# Patient Record
Sex: Female | Born: 1995 | Race: Black or African American | Hispanic: No | Marital: Single | Smoking: Never smoker
Health system: Southern US, Community
[De-identification: ages and names within clinical notes are randomized; demographics above are authoritative.]

## PROBLEM LIST (undated history)

## (undated) DIAGNOSIS — Z789 Other specified health status: Secondary | ICD-10-CM

## (undated) DIAGNOSIS — K5900 Constipation, unspecified: Secondary | ICD-10-CM

## (undated) DIAGNOSIS — A64 Unspecified sexually transmitted disease: Secondary | ICD-10-CM

## (undated) HISTORY — DX: Unspecified sexually transmitted disease: A64

## (undated) HISTORY — PX: NO PAST SURGERIES: SHX2092

## (undated) HISTORY — DX: Other specified health status: Z78.9

---
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
## 2017-12-30 ENCOUNTER — Other Ambulatory Visit: Payer: Self-pay | Admitting: Internal Medicine

## 2017-12-30 DIAGNOSIS — R1031 Right lower quadrant pain: Secondary | ICD-10-CM

## 2017-12-30 DIAGNOSIS — R1032 Left lower quadrant pain: Principal | ICD-10-CM

## 2018-01-28 ENCOUNTER — Emergency Department: Payer: 59

## 2018-01-28 ENCOUNTER — Emergency Department
Admission: EM | Admit: 2018-01-28 | Discharge: 2018-01-28 | Disposition: A | Discharge status: Home / Self Care | Payer: 59 | Attending: Emergency Medicine | Admitting: Emergency Medicine

## 2018-01-28 ENCOUNTER — Other Ambulatory Visit: Payer: Self-pay

## 2018-01-28 DIAGNOSIS — R1030 Lower abdominal pain, unspecified: Secondary | ICD-10-CM | POA: Insufficient documentation

## 2018-01-28 DIAGNOSIS — K921 Melena: Secondary | ICD-10-CM

## 2018-01-28 DIAGNOSIS — K625 Hemorrhage of anus and rectum: Secondary | ICD-10-CM | POA: Diagnosis present

## 2018-01-28 HISTORY — DX: Constipation, unspecified: K59.00

## 2018-01-28 LAB — COMPREHENSIVE METABOLIC PANEL
ALT: 17 U/L (ref 14–54)
ANION GAP: 9 (ref 5–15)
AST: 25 U/L (ref 15–41)
Albumin: 4.1 g/dL (ref 3.5–5.0)
Alkaline Phosphatase: 80 U/L (ref 38–126)
BUN: 8 mg/dL (ref 6–20)
CHLORIDE: 106 mmol/L (ref 101–111)
CO2: 24 mmol/L (ref 22–32)
Calcium: 9.5 mg/dL (ref 8.9–10.3)
Creatinine, Ser: 0.62 mg/dL (ref 0.44–1.00)
GFR calc Af Amer: >60 mL/min (ref >60)
GFR calc non Af Amer: >60 mL/min (ref >60)
GLUCOSE: 80 mg/dL (ref 65–99)
POTASSIUM: 4.2 mmol/L (ref 3.5–5.1)
SODIUM: 139 mmol/L (ref 135–145)
Total Bilirubin: 0.5 mg/dL (ref 0.3–1.2)
Total Protein: 7.7 g/dL (ref 6.5–8.1)

## 2018-01-28 LAB — CBC
HEMATOCRIT: 38.5 % (ref 35.0–47.0)
HEMOGLOBIN: 12.4 g/dL (ref 12.0–16.0)
MCH: 27.8 pg (ref 26.0–34.0)
MCHC: 32.1 g/dL (ref 32.0–36.0)
MCV: 86.5 fL (ref 80.0–100.0)
Platelets: 360 10*3/uL (ref 150–440)
RBC: 4.46 MIL/uL (ref 3.80–5.20)
RDW: 13 % (ref 11.5–14.5)
WBC: 6.4 10*3/uL (ref 3.6–11.0)

## 2018-01-28 LAB — URINALYSIS, COMPLETE (UACMP) WITH MICROSCOPIC
BACTERIA UA: NONE SEEN
BILIRUBIN URINE: NEGATIVE
Glucose, UA: NEGATIVE mg/dL
HGB URINE DIPSTICK: NEGATIVE
Ketones, ur: NEGATIVE mg/dL
Nitrite: NEGATIVE
PROTEIN: NEGATIVE mg/dL
SPECIFIC GRAVITY, URINE: 1.015 (ref 1.005–1.030)
pH: 5 (ref 5.0–8.0)

## 2018-01-28 LAB — POCT PREGNANCY, URINE: PREG TEST UR: NEGATIVE

## 2018-01-28 LAB — LIPASE, BLOOD: LIPASE: 26 U/L (ref 11–51)

## 2018-01-28 MED ORDER — IOPAMIDOL (ISOVUE-300) INJECTION 61%
30.0000 mL | Freq: Once | INTRAVENOUS | Status: AC | PRN
  Administered 2018-01-28: 30 mL via ORAL
  Filled 2018-01-28: qty 30

## 2018-01-28 MED ORDER — IOPAMIDOL (ISOVUE-300) INJECTION 61%
100.0000 mL | Freq: Once | INTRAVENOUS | Status: AC | PRN
  Administered 2018-01-28: 100 mL via INTRAVENOUS
  Filled 2018-01-28: qty 100

## 2018-01-28 NOTE — ED Notes (Addendum)
Pt returned from CT °

## 2018-01-28 NOTE — ED Provider Notes (Signed)
[facility] Emergency Department Provider Note  ____________________________________________   First MD Initiated Contact with Patient 01/28/18 1758     (approximate)  I have reviewed the triage vital signs and the nursing notes.   HISTORY  Chief Complaint Constipation and Rectal Bleeding    HPI Brandi Esmeralda ArthurKimber is a 22 y.o. female with medical history as listed below who presents for evaluation of new onset bright red blood per rectum associated with bowel movements.  This is been going on for a couple of days.  She states that she has also had intermittent lower abdominal pain for weeks but which is been more frequent and more severe over the last few days.  She has a history of constipation for years and occasionally takes MiraLAX and she does feel that constipation is an ongoing issue but she has never had bleeding before.  She has not been straining harder than usual recently.  She denies fever/chills, chest pain, shortness of breath, nausea, vomiting, and upper abdominal pain.  She states that she has been sexually active in the past but that she always, without fail, make sure that her partner used condoms.  She also reports that her primary care provider did a pelvic exam and took swabs recently and she does not want to have another exam today.  Nothing in particular makes her symptoms better or worse.  Past Medical History:  Diagnosis Date  . Constipation   . Medical history non-contributory     Patient Active Problem List   Diagnosis Date Noted  . MDD (major depressive disorder), recurrent episode, severe (HCC) 01/11/2013  . PTSD (post-traumatic stress disorder) 01/11/2013  . 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 NO PAST SURGERIES      Prior to Admission medications   Medication Sig Start Date End Date Taking? Authorizing Provider  buPROPion (WELLBUTRIN XL) 150 MG 24 hr tablet Take 1 tablet  (150 mg total) by mouth daily. 01/15/13   Winson, Louie BunKim B, NP  ibuprofen (ADVIL,MOTRIN) 200 MG tablet Take 2 tablets (400 mg total) by mouth every 6 (six) hours as needed for pain or headache. Patient may resume home supply. 01/15/13   Winson, Louie BunKim B, NP  mirtazapine (REMERON) 15 MG tablet Take 1 tablet (15 mg total) by mouth at bedtime. 01/15/13   Jolene SchimkeWinson, Kim B, NP    Allergies Patient has no known allergies.  No family history on file.  Social History Social History   Tobacco Use  . Smoking status: Never Smoker  . Smokeless tobacco: Never Used  Substance Use Topics  . Alcohol use: No  . Drug use: Yes    Comment: once 1 - 2 months ago    Review of Systems Constitutional: No fever/chills Eyes: No visual changes. ENT: No sore throat. Cardiovascular: Denies chest pain. Respiratory: Denies shortness of breath. Gastrointestinal: Abdominal pain, constipation, and new onset rectal bleeding as described above in HPI  genitourinary: Negative for dysuria.  Last menstrual period was about 2 weeks ago Musculoskeletal: Negative for neck pain.  Negative for back pain. Integumentary: Negative for rash. Neurological: Negative for headaches, focal weakness or numbness.   ____________________________________________   PHYSICAL EXAM:  VITAL SIGNS: ED Triage Vitals [01/28/18 1605]  Enc Vitals Group     BP 119/75     Pulse Rate 62     Resp 17     Temp 97.9 F (36.6 C)     Temp Source Oral  SpO2 100 %     Weight 70.3 kg (155 lb)     Height 1.626 m (5\' 4" )     Head Circumference      Peak Flow      Pain Score 0     Pain Loc      Pain Edu?      Excl. in GC?     Constitutional: Alert and oriented. Well appearing and in no acute distress. Eyes: Conjunctivae are normal.  Head: Atraumatic. Nose: No congestion/rhinnorhea. Mouth/Throat: Mucous membranes are moist. Neck: No stridor.  No meningeal signs.   Cardiovascular: Normal rate, regular rhythm. Good peripheral circulation.  Grossly normal heart sounds. Respiratory: Normal respiratory effort.  No retractions. Lungs CTAB. Gastrointestinal: Soft with no rebound or guarding and no distention.  Mild to moderate periumbilical tenderness to palpation. Rectal: No external hemorrhoids.  Nontender digital exam.  No significant amount of stool in the rectal vault and no visible blood but the Hemoccult card was strongly positive.  ED chaperone present throughout exam. Genitourinary: Deferred due to patient preference Musculoskeletal: No lower extremity tenderness nor edema. No gross deformities of extremities. Neurologic:  Normal speech and language. No gross focal neurologic deficits are appreciated.  Skin:  Skin is warm, dry and intact. No rash noted. Psychiatric: Mood and affect are normal. Speech and behavior are normal.  ____________________________________________   LABS (all labs ordered are listed, but only abnormal results are displayed)  Labs Reviewed  URINALYSIS, COMPLETE (UACMP) WITH MICROSCOPIC - Abnormal; Notable for the following components:      Result Value   Color, Urine YELLOW (*)    APPearance HAZY (*)    Leukocytes, UA SMALL (*)    Squamous Epithelial / LPF 0-5 (*)    All other components within normal limits  URINE CULTURE  LIPASE, BLOOD  COMPREHENSIVE METABOLIC PANEL  CBC  POC URINE PREG, ED  POCT PREGNANCY, URINE   ____________________________________________  EKG  None - EKG not ordered by ED physician ____________________________________________  RADIOLOGY   ED MD interpretation: No acute abnormalities in the abdomen or pelvis  Official radiology report(s): Ct Abdomen Pelvis W Contrast  Result Date: 01/28/2018 CLINICAL DATA:  Constipation bright red blood with bowel movement EXAM: CT ABDOMEN AND PELVIS WITH CONTRAST TECHNIQUE: Multidetector CT imaging of the abdomen and pelvis was performed using the standard protocol following bolus administration of intravenous contrast.  CONTRAST:  ISOVUE-300 IOPAMIDOL (ISOVUE-300) INJECTION 61% COMPARISON:  None. FINDINGS: Lower chest: No acute abnormality. Hepatobiliary: No focal liver abnormality is seen. No gallstones, gallbladder wall thickening, or biliary dilatation. Pancreas: Unremarkable. No pancreatic ductal dilatation or surrounding inflammatory changes. Spleen: Normal in size without focal abnormality. Adrenals/Urinary Tract: Adrenal glands are unremarkable. Kidneys are normal, without renal calculi, focal lesion, or hydronephrosis. Bladder is unremarkable. Stomach/Bowel: Stomach is within normal limits. Appendix appears normal. No evidence of bowel wall thickening, distention, or inflammatory changes. Vascular/Lymphatic: No significant vascular findings are present. No enlarged abdominal or pelvic lymph nodes. Reproductive: Uterus and bilateral adnexa are unremarkable. Other: No abdominal wall hernia or abnormality. No abdominopelvic ascites. Musculoskeletal: No acute or significant osseous findings. IMPRESSION: Negative. No CT evidence for acute intra-abdominal or pelvic abnormality. Electronically Signed   By: Jasmine Pang M.D.   On: 01/28/2018 19:55    ____________________________________________   PROCEDURES  Critical Care performed: No   Procedure(s) performed:   Procedures   ____________________________________________   INITIAL IMPRESSION / ASSESSMENT AND PLAN / ED COURSE  As part of my medical decision  making, I reviewed the following data within the electronic MEDICAL RECORD NUMBER Nursing notes reviewed and incorporated and Labs reviewed     Differential diagnosis includes, but is not limited to, hemorrhoids, anal fissure or other bleeding associated with constipation, diverticular bleed, AV malformation, infectious process.  PID/STD and appendicitis are also possible for the cause of her abdominal pain, although STD/PID/TOA are less likely given that the patient insists she was recently tested for  STDs and that she always uses condoms when having intercourse.  Her lab work is all reassuring with normal CMP, negative urine pregnancy test, normal CBC.  Urinalysis has a few white blood cells but she has no dysuria.  I have ordered a urine culture to further assess if she needs antibiotics but I do not want to treat her if she is asymptomatic with an equivocal test.  I explained a pelvic exam would be appropriate but she does not want to proceed with one and I think that if she is remembering correctly about always using condoms, PID/TOA would be very unlikely based on her current presentation.  Given her tenderness to palpation and new onset rectal bleeding I think it is appropriate to obtain a CT scan of her abdomen and pelvis with oral and IV contrast.  It may be simply the results of her chronic constipation but this will be a good screening for acute/emergent causes of abdominal pain and bleeding, and then she should be appropriate for outpatient follow-up with gastroenterology.  She understands and agrees with the plan.  Clinical Course as of Jan 29 2043  Wed Jan 28, 2018  2041 CT scan is reassuring with no acute abnormality in the abdomen or pelvis.  Patient has been asymptomatic when I checked on her she is feeling well and using her phone without any difficulties.  I updated her and her family member about the results and encouraged her to follow-up closely with GI.  I gave my usual customary return precautions.  She understands and agrees with the plan.   [CF]    Clinical Course User Index [CF] Loleta Rose, MD    ____________________________________________  FINAL CLINICAL IMPRESSION(S) / ED DIAGNOSES  Final diagnoses:  Hematochezia     MEDICATIONS GIVEN DURING THIS VISIT:  Medications  iopamidol (ISOVUE-300) 61 % injection 30 mL (30 mLs Oral Contrast Given 01/28/18 1828)  iopamidol (ISOVUE-300) 61 % injection 100 mL (100 mLs Intravenous Contrast Given 01/28/18 1939)     ED  Discharge Orders    None       Note:  This document was prepared using Dragon voice recognition software and may include unintentional dictation errors.    Loleta Rose, MD 01/28/18 2044

## 2018-01-28 NOTE — ED Triage Notes (Signed)
Pt referred to the ED for bright red blood with BM, states she has issues with constipation and takes miralax PRN, states last BM was today and had more then normal red blood with hard stool. Pt c/o lower abd pain..Marland Kitchen

## 2018-01-28 NOTE — Discharge Instructions (Addendum)
Your workup in the Emergency Department today was reassuring.  We did not find any specific abnormalities.  We recommend you drink plenty of fluids, take your regular medications and/or any new ones prescribed today, and follow up with the doctor(s) listed in these documents as recommended.  Return to the Emergency Department if you develop new or worsening symptoms that concern you.  

## 2018-01-28 NOTE — ED Notes (Signed)
Pt to CT

## 2018-01-29 ENCOUNTER — Telehealth: Payer: Self-pay | Admitting: Gastroenterology

## 2018-01-29 NOTE — Telephone Encounter (Signed)
Left vm to set up apt per Note with Dr. Tobi BastosAnna for hematocheziar

## 2018-01-30 LAB — URINE CULTURE
Culture: <10000 — AB
SPECIAL REQUESTS: NORMAL

## 2018-02-04 ENCOUNTER — Ambulatory Visit: Admission: RE | Admit: 2018-02-04 | Payer: 59 | Source: Ambulatory Visit

## 2018-09-16 ENCOUNTER — Encounter: Payer: Self-pay | Admitting: Emergency Medicine

## 2018-09-16 ENCOUNTER — Other Ambulatory Visit: Payer: Self-pay

## 2018-09-16 ENCOUNTER — Emergency Department: Payer: 59

## 2018-09-16 ENCOUNTER — Emergency Department
Admission: EM | Admit: 2018-09-16 | Discharge: 2018-09-16 | Disposition: A | Discharge status: Home / Self Care | Payer: 59 | Attending: Student in an Organized Health Care Education/Training Program | Admitting: Student in an Organized Health Care Education/Training Program

## 2018-09-16 DIAGNOSIS — R51 Headache: Secondary | ICD-10-CM | POA: Insufficient documentation

## 2018-09-16 DIAGNOSIS — Z79899 Other long term (current) drug therapy: Secondary | ICD-10-CM | POA: Insufficient documentation

## 2018-09-16 DIAGNOSIS — R519 Headache, unspecified: Secondary | ICD-10-CM

## 2018-09-16 LAB — POCT I-STAT, CHEM 8
BUN: 5 mg/dL — AB (ref 6–20)
CHLORIDE: 107 mmol/L (ref 98–111)
CREATININE: 0.6 mg/dL (ref 0.44–1.00)
Calcium, Ion: 1.2 mmol/L (ref 1.15–1.40)
Glucose, Bld: 88 mg/dL (ref 70–99)
HCT: 40 % (ref 36.0–46.0)
HEMOGLOBIN: 13.6 g/dL (ref 12.0–15.0)
POTASSIUM: 3.7 mmol/L (ref 3.5–5.1)
Sodium: 142 mmol/L (ref 135–145)
TCO2: 25 mmol/L (ref 22–32)

## 2018-09-16 LAB — I-STAT BETA HCG BLOOD, ED (NOT ORDERABLE): I-stat hCG, quantitative: <5 m[IU]/mL (ref <5)

## 2018-09-16 MED ORDER — DIPHENHYDRAMINE HCL 50 MG/ML IJ SOLN
12.5000 mg | Freq: Once | INTRAMUSCULAR | Status: AC
  Administered 2018-09-16: 12.5 mg via INTRAVENOUS
  Filled 2018-09-16: qty 1

## 2018-09-16 MED ORDER — DEXAMETHASONE SODIUM PHOSPHATE 10 MG/ML IJ SOLN
10.0000 mg | Freq: Once | INTRAMUSCULAR | Status: AC
  Administered 2018-09-16: 10 mg via INTRAVENOUS

## 2018-09-16 MED ORDER — KETOROLAC TROMETHAMINE 30 MG/ML IJ SOLN
15.0000 mg | Freq: Once | INTRAMUSCULAR | Status: AC
  Administered 2018-09-16: 15 mg via INTRAVENOUS

## 2018-09-16 MED ORDER — LIDOCAINE HCL (PF) 1 % IJ SOLN
INTRAMUSCULAR | Status: AC
  Administered 2018-09-16: 18:00:00
  Filled 2018-09-16: qty 5

## 2018-09-16 MED ORDER — PROCHLORPERAZINE EDISYLATE 10 MG/2ML IJ SOLN
INTRAMUSCULAR | Status: AC
  Administered 2018-09-16: 10 mg via INTRAVENOUS
  Filled 2018-09-16: qty 2

## 2018-09-16 MED ORDER — DEXAMETHASONE SODIUM PHOSPHATE 10 MG/ML IJ SOLN
INTRAMUSCULAR | Status: AC
  Administered 2018-09-16: 10 mg via INTRAVENOUS
  Filled 2018-09-16: qty 1

## 2018-09-16 MED ORDER — ACETAMINOPHEN 500 MG PO TABS
1000.0000 mg | ORAL_TABLET | Freq: Once | ORAL | Status: AC
  Administered 2018-09-16: 1000 mg via ORAL
  Filled 2018-09-16: qty 2

## 2018-09-16 MED ORDER — PROCHLORPERAZINE EDISYLATE 10 MG/2ML IJ SOLN
10.0000 mg | Freq: Once | INTRAMUSCULAR | Status: AC
  Administered 2018-09-16: 10 mg via INTRAVENOUS
  Filled 2018-09-16: qty 2

## 2018-09-16 MED ORDER — KETOROLAC TROMETHAMINE 30 MG/ML IJ SOLN
INTRAMUSCULAR | Status: AC
  Administered 2018-09-16: 15 mg via INTRAVENOUS
  Filled 2018-09-16: qty 1

## 2018-09-16 MED ORDER — DIPHENHYDRAMINE HCL 50 MG/ML IJ SOLN
INTRAMUSCULAR | Status: AC
  Administered 2018-09-16: 12.5 mg via INTRAVENOUS
  Filled 2018-09-16: qty 1

## 2018-09-16 NOTE — ED Provider Notes (Signed)
[facility] Emergency Department Provider Note    First MD Initiated Contact with Patient 09/16/18 1601     (approximate)  I have reviewed the triage vital signs and the nursing notes.   HISTORY  Chief Complaint Headache    HPI Brandi Pearson is a 22 y.o. female no significant past medical history presents the ER with new onset severe headache this started around 2 PM while at work associated with blurry vision nausea, photophobia and phonophobia.  States is been steadily progressing.  Denies any numbness or tingling.  States that she did have an aura prior to the headache starting and is primarily having a right frontal headache at this time.  States that she did just recently get over a upper respiratory infection about a week and a half ago denies any fevers.  No neck stiffness.    Past Medical History:  Diagnosis Date  . Constipation   . Medical history non-contributory    No family history on file. Past Surgical History:  Procedure Laterality Date  . NO PAST SURGERIES     Patient Active Problem List   Diagnosis Date Noted  . MDD (major depressive disorder), recurrent episode, severe (HCC) 01/11/2013  . PTSD (post-traumatic stress disorder) 01/11/2013  . 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 Authorizing Provider  buPROPion (WELLBUTRIN XL) 150 MG 24 hr tablet Take 1 tablet (150 mg total) by mouth daily. 01/15/13   Winson, Louie Bun, NP  ibuprofen (ADVIL,MOTRIN) 200 MG tablet Take 2 tablets (400 mg total) by mouth every 6 (six) hours as needed for pain or headache. Patient may resume home supply. 01/15/13   Winson, Louie Bun, NP  mirtazapine (REMERON) 15 MG tablet Take 1 tablet (15 mg total) by mouth at bedtime. 01/15/13   Jolene Schimke, NP    Allergies Patient has no known allergies.    Social History Social History   Tobacco Use  . Smoking status: Never Smoker  .  Smokeless tobacco: Never Used  Substance Use Topics  . Alcohol use: No  . Drug use: Not Currently    Review of Systems Patient denies headaches, rhinorrhea, blurry vision, numbness, shortness of breath, chest pain, edema, cough, abdominal pain, nausea, vomiting, diarrhea, dysuria, fevers, rashes or hallucinations unless otherwise stated above in HPI. ____________________________________________   PHYSICAL EXAM:  VITAL SIGNS: Vitals:   09/16/18 1530 09/16/18 1554  BP:  (!) 127/107  Pulse:  70  Resp:  18  Temp:  (!) 97.5 F (36.4 C)  SpO2: 98% 100%    Constitutional: Alert and oriented. Uncomfortable appearing lying in bed in dark room Eyes: Conjunctivae are normal.  Head: Atraumatic. Nose: No congestion/rhinnorhea. Mouth/Throat: Mucous membranes are moist.   Neck: No stridor. Painless ROM.  Cardiovascular: Normal rate, regular rhythm. Grossly normal heart sounds.  Good peripheral circulation. Respiratory: Normal respiratory effort.  No retractions. Lungs CTAB. Gastrointestinal: Soft and nontender. No distention. No abdominal bruits. No CVA tenderness. Genitourinary:  Musculoskeletal: No lower extremity tenderness nor edema.  No joint effusions. Neurologic:  CN- intact.  No facial droop, Normal FNF.  Normal heel to shin.  Sensation intact bilaterally. Normal speech and language. No gross focal neurologic deficits are appreciated. No gait instability. Skin:  Skin is warm, dry and intact. No rash noted. Psychiatric: Mood and affect are normal. Speech and behavior are normal.  ____________________________________________   LABS (all labs ordered are listed,  but only abnormal results are displayed)  Results for orders placed or performed during the hospital encounter of 09/16/18 (from the past 24 hour(s))  I-STAT, chem 8     Status: Abnormal   Collection Time: 09/16/18  5:11 PM  Result Value Ref Range   Sodium 142 135 - 145 mmol/L   Potassium 3.7 3.5 - 5.1 mmol/L    Chloride 107 98 - 111 mmol/L   BUN 5 (L) 6 - 20 mg/dL   Creatinine, Ser 1.610.60 0.44 - 1.00 mg/dL   Glucose, Bld 88 70 - 99 mg/dL   Calcium, Ion 0.961.20 1.15 - 1.40 mmol/L   TCO2 25 22 - 32 mmol/L   Hemoglobin 13.6 12.0 - 15.0 g/dL   HCT 04.540.0 40.936.0 - 81.146.0 %  I-Stat beta hCG blood, ED     Status: None   Collection Time: 09/16/18  5:24 PM  Result Value Ref Range   I-stat hCG, quantitative <5.0 <5 mIU/mL   Comment 3           ____________________________________________  ____________________________________________  RADIOLOGY   ____________________________________________   PROCEDURES  Procedure(s) performed:  Procedures  Due to difficulty with obtaining IV access, a 20G peripheral IV catheter was inserted using US guidance into the RUE.  The site was prepped with chlorhexidine and allowed to dry.  The patient tolerated the procedure without any complications.   Critical Care performed: no ____________________________________________   INITIAL IMPRESSION / ASSESSMENT AND PLAN / ED COURSE  Pertinent labs & imaging results that were available during my care of the patient were reviewed by me and considered in my medical decision making (see chart for details).   DDX: migraine, tension, mass, sah, sdh, iph, unlikely cvt, glaucoma, infectious process  Brandi Pearson is a 22 y.o. who presents to the ED with p/w HA starting around 2 pm today.  Denies focal neurologic symptoms.. Denies trauma. No fevers or neck pain. No vision loss. + blurry vision and phonophobia Afebrile in ED. VSS. Exam as above. No meningeal signs. No CN, motor, sensory or cerebellar deficits. Temporal arteries palpable and non-tender. Appears uncomfortable but non-toxic.  Will provide IV fluids for hydration and IV medications for symptom control. Likely tension, non-specific or possible migraine HA. Clinical picture is not consistent with ICH, SAH, SDH, EDH, TIA, or CVA. No concern for meningitis or encephalitis. No  concern for GCA/Temporal arteritis.  Given sudden and recent onset of headache without h/o headaches will order CT head to exclude hemorrhage.  Clinical Course as of Sep 16 1845  Wed Sep 16, 2018  1820 CT head is working reassuring.  She has no signs of meningismus with a normal neuro exam.  Given this presentation I do believe that the risks of further diagnostic testing including LP exceed pretest probability of subarachnoid or aneurysm in this otherwise well-appearing young female.  Her headache is resolved currently.  Will give dose of decadron to reduce likihood of rebound headache.   [PR]  1843 Patient reassessed.  Ambulating with steady gait.  Repeat neuro exam is nonfocal.   [PR]  1844   Tolerating oral hydration.  At this point believe she stable and appropriate for outpatient follow-up.   [PR]    Clinical Course User Index [PR] Willy Eddyobinson, Omer Monter, MD     As part of my medical decision making, I reviewed the following data within the electronic MEDICAL RECORD NUMBER Nursing notes reviewed and incorporated, Labs reviewed, notes from prior ED visits and [place] Controlled Substance Database  ____________________________________________   FINAL CLINICAL IMPRESSION(S) / ED DIAGNOSES  Final diagnoses:  Bad headache      NEW MEDICATIONS STARTED DURING THIS VISIT:  New Prescriptions   No medications on file     Note:  This document was prepared using Dragon voice recognition software and may include unintentional dictation errors.    Willy Eddy, MD 09/16/18 [phone]
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
## 2018-09-16 NOTE — Discharge Instructions (Addendum)

## 2018-09-16 NOTE — ED Notes (Signed)
Attempted to start PIV x 2.  Unsuccessful.

## 2018-09-16 NOTE — ED Triage Notes (Signed)
Pt in via [facility] with complaints of sudden onset severe headache w/ associated blurred vision.  Pt tearful in triage, denies hx of migraines.  Vitals WDL.

## 2018-10-26 ENCOUNTER — Encounter: Payer: Self-pay | Admitting: Emergency Medicine

## 2018-10-26 ENCOUNTER — Emergency Department
Admission: EM | Admit: 2018-10-26 | Discharge: 2018-10-26 | Disposition: A | Discharge status: Home / Self Care | Payer: 59 | Attending: Emergency Medicine | Admitting: Emergency Medicine

## 2018-10-26 ENCOUNTER — Emergency Department: Payer: 59

## 2018-10-26 DIAGNOSIS — R079 Chest pain, unspecified: Secondary | ICD-10-CM

## 2018-10-26 DIAGNOSIS — R071 Chest pain on breathing: Secondary | ICD-10-CM | POA: Diagnosis not present

## 2018-10-26 LAB — BASIC METABOLIC PANEL
ANION GAP: 5 (ref 5–15)
BUN: 7 mg/dL (ref 6–20)
CO2: 24 mmol/L (ref 22–32)
Calcium: 8.9 mg/dL (ref 8.9–10.3)
Chloride: 108 mmol/L (ref 98–111)
Creatinine, Ser: 0.66 mg/dL (ref 0.44–1.00)
GFR calc Af Amer: >60 mL/min (ref >60)
GFR calc non Af Amer: >60 mL/min (ref >60)
GLUCOSE: 70 mg/dL (ref 70–99)
Potassium: 3.8 mmol/L (ref 3.5–5.1)
Sodium: 137 mmol/L (ref 135–145)

## 2018-10-26 LAB — CBC
HCT: 38.4 % (ref 36.0–46.0)
HEMOGLOBIN: 12 g/dL (ref 12.0–15.0)
MCH: 26.2 pg (ref 26.0–34.0)
MCHC: 31.3 g/dL (ref 30.0–36.0)
MCV: 83.8 fL (ref 80.0–100.0)
PLATELETS: 357 10*3/uL (ref 150–400)
RBC: 4.58 MIL/uL (ref 3.87–5.11)
RDW: 13.1 % (ref 11.5–15.5)
WBC: 5.3 10*3/uL (ref 4.0–10.5)
nRBC: 0 % (ref 0.0–0.2)

## 2018-10-26 LAB — TROPONIN I: Troponin I: <0.03 ng/mL (ref <0.03)

## 2018-10-26 NOTE — ED Triage Notes (Signed)
Pt reports for the last month she has had a heavy feeling in her upper chest and it is painful to touch. Pt reports the pain is progressively getting worse. Pt reports has not seen a MD about it. Pt reports some SOB at times, denies dizziness.

## 2018-10-26 NOTE — ED Provider Notes (Signed)
[facility] Emergency Department Provider Note  ____________________________________________   First MD Initiated Contact with Patient 10/26/18 1320     (approximate)  I have reviewed the triage vital signs and the nursing notes.   HISTORY  Chief Complaint Chest Pain   HPI Brandi Pearson is a 22 y.o. female with a history of anxiety who is presenting to the emergency department today with upper, central and right-sided chest pain over the past 2 months.  Says it worsens with deep breathing and also with anxiety.  Denies any nausea, vomiting or diaphoresis.  Says the pain is a 5-6 out of 10 at this time.  Denies any radiation of the pain.  States that she is not on any exogenous hormones.  No sudden death in young people in her family or people who get heart disease at young age.  Denies any heavy lifting or injury.  Says that she did have an upper respiratory infection approximate 1 month ago but these symptoms of chest pain started prior to that.   Past Medical History:  Diagnosis Date  . Constipation   . Medical history non-contributory     Patient Active Problem List   Diagnosis Date Noted  . MDD (major depressive disorder), recurrent episode, severe (HCC) 01/11/2013  . PTSD (post-traumatic stress disorder) 01/11/2013  . 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 NO PAST SURGERIES      Prior to Admission medications   Medication Sig Start Date End Date Taking? Authorizing Provider  buPROPion (WELLBUTRIN XL) 150 MG 24 hr tablet Take 1 tablet (150 mg total) by mouth daily. 01/15/13   Winson, Louie BunKim B, NP  ibuprofen (ADVIL,MOTRIN) 200 MG tablet Take 2 tablets (400 mg total) by mouth every 6 (six) hours as needed for pain or headache. Patient may resume home supply. 01/15/13   Winson, Louie BunKim B, NP  mirtazapine (REMERON) 15 MG tablet Take 1 tablet (15 mg total) by mouth at bedtime. 01/15/13   Jolene SchimkeWinson, Kim B, NP      Allergies Patient has no known allergies.  No family history on file.  Social History Social History   Tobacco Use  . Smoking status: Never Smoker  . Smokeless tobacco: Never Used  Substance Use Topics  . Alcohol use: No  . Drug use: Not Currently    Review of Systems  Constitutional: No fever/chills Eyes: No visual changes. ENT: No sore throat. Cardiovascular: As above Respiratory: Denies shortness of breath. Gastrointestinal: No abdominal pain.  No nausea, no vomiting.  No diarrhea.  No constipation. Genitourinary: Negative for dysuria. Musculoskeletal: Negative for back pain. Skin: Negative for rash. Neurological: Negative for headaches, focal weakness or numbness.   ____________________________________________   PHYSICAL EXAM:  VITAL SIGNS: ED Triage Vitals  Enc Vitals Group     BP 10/26/18 1205 110/60     Pulse Rate 10/26/18 1205 78     Resp 10/26/18 1205 18     Temp 10/26/18 1205 (!) 97.5 F (36.4 C)     Temp Source 10/26/18 1205 Oral     SpO2 10/26/18 1205 99 %     Weight 10/26/18 1119 160 lb (72.6 kg)     Height 10/26/18 1119 5\' 4"  (1.626 m)     Head Circumference --      Peak Flow --      Pain Score 10/26/18 1119 5     Pain Loc --      Pain Edu? --  Excl. in [place]? --     Constitutional: Alert and oriented. Well appearing and in no acute distress. Eyes: Conjunctivae are normal.  Head: Atraumatic. Nose: No congestion/rhinnorhea. Mouth/Throat: Mucous membranes are moist.  Neck: No stridor.   Cardiovascular: Normal rate, regular rhythm. Grossly normal heart sounds.  Chest pain reproducible to palpation over the superior sternal area as well as the right upper chest.  No crepitus.  No ecchymosis. Respiratory: Normal respiratory effort.  No retractions. Lungs CTAB. Gastrointestinal: Soft and nontender. No distention. Musculoskeletal: No lower extremity tenderness nor edema.  No joint effusions. Neurologic:  Normal speech and language. No  gross focal neurologic deficits are appreciated. Skin:  Skin is warm, dry and intact. No rash noted. Psychiatric: Mood and affect are normal. Speech and behavior are normal.  ____________________________________________   LABS (all labs ordered are listed, but only abnormal results are displayed)  Labs Reviewed  BASIC METABOLIC PANEL  CBC  TROPONIN I  POC URINE PREG, ED   ____________________________________________  EKG  ED ECG REPORT I, Arelia Longestavid M Nadezhda Pollitt, the attending physician, personally viewed and interpreted this ECG.   Date: 10/26/2018  EKG Time: 1122  Rate: 70  Rhythm: normal sinus rhythm  Axis: Normal  Intervals:none  ST&T Change: No ST segment elevation or depression.  No abnormal T wave inversion.  ____________________________________________  RADIOLOGY  Chest x-ray without acute process. ____________________________________________   PROCEDURES  Procedure(s) performed:   Procedures  Critical Care performed:   ____________________________________________   INITIAL IMPRESSION / ASSESSMENT AND PLAN / ED COURSE  Pertinent labs & imaging results that were available during my care of the patient were reviewed by me and considered in my medical decision making (see chart for details).  Differential diagnosis includes, but is not limited to, ACS, aortic dissection, pulmonary embolism, cardiac tamponade, pneumothorax, pneumonia, pericarditis, myocarditis, GI-related causes including esophagitis/gastritis, and musculoskeletal chest wall pain.   As part of my medical decision making, I reviewed the following data within the electronic MEDICAL RECORD NUMBER Notes from prior ED visits  Patient is PE RC negative.  Very typical story for ACS and very young for this condition.  Likely chest wall pain versus pleurisy.  Patient will try topical salves for pain relief.  She will follow-up with her primary care doctor.  Will be discharged at this time.  She is  understanding the diagnosis well treatment and willing to comply. ____________________________________________   FINAL CLINICAL IMPRESSION(S) / ED DIAGNOSES  Chest wall pain.  NEW MEDICATIONS STARTED DURING THIS VISIT:  New Prescriptions   No medications on file     Note:  This document was prepared using Dragon voice recognition software and may include unintentional dictation errors.     Myrna BlazerSchaevitz, Kathlynn Swofford Matthew, MD 10/26/18 [phone]

## 2019-09-15 ENCOUNTER — Encounter: Payer: Self-pay | Admitting: Emergency Medicine

## 2019-09-15 ENCOUNTER — Emergency Department
Admission: EM | Admit: 2019-09-15 | Discharge: 2019-09-15 | Disposition: A | Discharge status: Home / Self Care | Payer: 59 | Attending: Emergency Medicine | Admitting: Emergency Medicine

## 2019-09-15 ENCOUNTER — Other Ambulatory Visit: Payer: Self-pay

## 2019-09-15 DIAGNOSIS — J029 Acute pharyngitis, unspecified: Secondary | ICD-10-CM | POA: Diagnosis present

## 2019-09-15 DIAGNOSIS — Z79899 Other long term (current) drug therapy: Secondary | ICD-10-CM | POA: Diagnosis not present

## 2019-09-15 DIAGNOSIS — J069 Acute upper respiratory infection, unspecified: Secondary | ICD-10-CM | POA: Diagnosis not present

## 2019-09-15 DIAGNOSIS — Z20828 Contact with and (suspected) exposure to other viral communicable diseases: Secondary | ICD-10-CM | POA: Diagnosis not present

## 2019-09-15 DIAGNOSIS — Z20822 Contact with and (suspected) exposure to covid-19: Secondary | ICD-10-CM

## 2019-09-15 LAB — CBC WITH DIFFERENTIAL/PLATELET
Abs Immature Granulocytes: 0.01 10*3/uL (ref 0.00–0.07)
Basophils Absolute: 0.1 10*3/uL (ref 0.0–0.1)
Basophils Relative: 1 %
Eosinophils Absolute: 0.1 10*3/uL (ref 0.0–0.5)
Eosinophils Relative: 1 %
HCT: 38 % (ref 36.0–46.0)
Hemoglobin: 12 g/dL (ref 12.0–15.0)
Immature Granulocytes: 0 %
Lymphocytes Relative: 34 %
Lymphs Abs: 2.2 10*3/uL (ref 0.7–4.0)
MCH: 27 pg (ref 26.0–34.0)
MCHC: 31.6 g/dL (ref 30.0–36.0)
MCV: 85.6 fL (ref 80.0–100.0)
Monocytes Absolute: 0.8 10*3/uL (ref 0.1–1.0)
Monocytes Relative: 13 %
Neutro Abs: 3.3 10*3/uL (ref 1.7–7.7)
Neutrophils Relative %: 51 %
Platelets: 344 10*3/uL (ref 150–400)
RBC: 4.44 MIL/uL (ref 3.87–5.11)
RDW: 12.5 % (ref 11.5–15.5)
WBC: 6.5 10*3/uL (ref 4.0–10.5)
nRBC: 0 % (ref 0.0–0.2)

## 2019-09-15 LAB — COMPREHENSIVE METABOLIC PANEL
ALT: 14 U/L (ref 0–44)
AST: 23 U/L (ref 15–41)
Albumin: 3.7 g/dL (ref 3.5–5.0)
Alkaline Phosphatase: 89 U/L (ref 38–126)
Anion gap: 6 (ref 5–15)
BUN: 8 mg/dL (ref 6–20)
CO2: 24 mmol/L (ref 22–32)
Calcium: 8.9 mg/dL (ref 8.9–10.3)
Chloride: 110 mmol/L (ref 98–111)
Creatinine, Ser: 0.6 mg/dL (ref 0.44–1.00)
GFR calc Af Amer: >60 mL/min (ref >60)
GFR calc non Af Amer: >60 mL/min (ref >60)
Glucose, Bld: 105 mg/dL — ABNORMAL HIGH (ref 70–99)
Potassium: 4 mmol/L (ref 3.5–5.1)
Sodium: 140 mmol/L (ref 135–145)
Total Bilirubin: 0.6 mg/dL (ref 0.3–1.2)
Total Protein: 7.4 g/dL (ref 6.5–8.1)

## 2019-09-15 LAB — URINALYSIS, COMPLETE (UACMP) WITH MICROSCOPIC
Bilirubin Urine: NEGATIVE
Glucose, UA: NEGATIVE mg/dL
Hgb urine dipstick: NEGATIVE
Ketones, ur: NEGATIVE mg/dL
Nitrite: NEGATIVE
Protein, ur: NEGATIVE mg/dL
Specific Gravity, Urine: 1.016 (ref 1.005–1.030)
pH: 6 (ref 5.0–8.0)

## 2019-09-15 LAB — INFLUENZA PANEL BY PCR (TYPE A & B)
Influenza A By PCR: NEGATIVE
Influenza B By PCR: NEGATIVE

## 2019-09-15 LAB — POCT PREGNANCY, URINE: Preg Test, Ur: NEGATIVE

## 2019-09-15 LAB — SARS CORONAVIRUS 2 (TAT 6-24 HRS): SARS Coronavirus 2: NEGATIVE

## 2019-09-15 MED ORDER — LIDOCAINE VISCOUS HCL 2 % MT SOLN: 15.0000 mL | OROMUCOSAL | 0 refills | Status: DC | PRN

## 2019-09-15 MED ORDER — IBUPROFEN 600 MG PO TABS
600.0000 mg | ORAL_TABLET | Freq: Once | ORAL | Status: AC
  Administered 2019-09-15: 05:00:00 600 mg via ORAL
  Filled 2019-09-15: qty 1

## 2019-09-15 MED ORDER — IBUPROFEN 600 MG PO TABS: 600.0000 mg | ORAL_TABLET | Freq: Four times a day (QID) | ORAL | 0 refills | Status: DC | PRN

## 2019-09-15 MED ORDER — ONDANSETRON 4 MG PO TBDP: 4.0000 mg | ORAL_TABLET | Freq: Three times a day (TID) | ORAL | 0 refills | Status: DC | PRN

## 2019-09-15 MED ORDER — LIDOCAINE VISCOUS HCL 2 % MT SOLN
15.0000 mL | Freq: Once | OROMUCOSAL | Status: AC
  Administered 2019-09-15: 05:00:00 15 mL via OROMUCOSAL
  Filled 2019-09-15: qty 15

## 2019-09-15 NOTE — ED Provider Notes (Signed)
[facility] Emergency Department Provider Note  ____________________________________________  Time seen: Approximately 4:22 AM  I have reviewed the triage vital signs and the nursing notes.   HISTORY  Chief Complaint Nasal Congestion, Neck Pain, and Sore Throat   HPI Brandi Pearson is a 23 y.o. female who presents for evaluation of sore throat, cough and congestion.  Symptoms started 2 days ago.  She is complaining of sore throat that is worse with swallowing.  She has been sneezing a lot.  Has had nasal congestion.  Has a dry cough.  Has subjective fever and diffuse body aches.  No nausea or vomiting, no chest pain or shortness of breath, no abdominal pain, no diarrhea.  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  Patient is otherwise healthy.   Past Medical History:  Diagnosis Date  . Constipation   . Medical history non-contributory     Patient Active Problem List   Diagnosis Date Noted  . MDD (major depressive disorder), recurrent episode, severe (HCC) 01/11/2013  . PTSD (post-traumatic stress disorder) 01/11/2013  . 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 NO PAST SURGERIES      Prior to Admission medications   Medication Sig Start Date End Date Taking? Authorizing Provider  buPROPion (WELLBUTRIN XL) 150 MG 24 hr tablet Take 1 tablet (150 mg total) by mouth daily. 01/15/13   Winson, Louie BunKim B, NP  ibuprofen (ADVIL) 600 MG tablet Take 1 tablet (600 mg total) by mouth every 6 (six) hours as needed. 09/15/19   Don PerkingVeronese, [place], MD  lidocaine (XYLOCAINE) 2 % solution Use as directed 15 mLs in the mouth or throat as needed for mouth pain. 09/15/19   Nita SickleVeronese, [place], MD  mirtazapine (REMERON) 15 MG tablet Take 1 tablet (15 mg total) by mouth at bedtime. 01/15/13   Winson, Louie BunKim B, NP  ondansetron (ZOFRAN ODT) 4 MG disintegrating tablet Take 1 tablet (4 mg total) by mouth every 8  (eight) hours as needed. 09/15/19   Nita SickleVeronese, , MD    Allergies Patient has no known allergies.  No family history on file.  Social History Social History   Tobacco Use  . Smoking status: Never Smoker  . Smokeless tobacco: Never Used  Substance Use Topics  . Alcohol use: No  . Drug use: Not Currently    Review of Systems  Constitutional: + fever, body aches Eyes: Negative for visual changes. ENT: +sore throat, congestion Neck: No neck pain  Cardiovascular: Negative for chest pain. Respiratory: Negative for shortness of breath. + cough Gastrointestinal: Negative for abdominal pain, vomiting or diarrhea. Genitourinary: Negative for dysuria. Musculoskeletal: Negative for back pain. Skin: Negative for rash. Neurological: Negative for headaches, weakness or numbness. Psych: No SI or HI  ____________________________________________   PHYSICAL EXAM:  VITAL SIGNS: ED Triage Vitals  Enc Vitals Group     BP 09/15/19 0111 115/70     Pulse Rate 09/15/19 0111 78     Resp 09/15/19 0111 20     Temp 09/15/19 0111 98.9 F (37.2 C)     Temp Source 09/15/19 0111 Oral     SpO2 09/15/19 0111 98 %     Weight 09/15/19 0113 180 lb (81.6 kg)     Height 09/15/19 0113 5\' 4"  (1.626 m)     Head Circumference --      Peak Flow --      Pain Score 09/15/19 0112 8  Pain Loc --      Pain Edu? --      Excl. in [place]? --     Constitutional: Alert and oriented. Well appearing and in no apparent distress. HEENT:      Head: Normocephalic and atraumatic.         Eyes: Conjunctivae are normal. Sclera is non-icteric.       Mouth/Throat: Mucous membranes are moist.  Oropharynx is clear with no exudate, erythema or swelling, no peritonsillar abscess, no trismus, floor of the mouth is soft      Neck: Supple with no signs of meningismus. Bilateral shotty cervical lymphadenopathy Cardiovascular: Regular rate and rhythm. No murmurs, gallops, or rubs. 2+ symmetrical distal pulses are present  in all extremities. No JVD. Respiratory: Normal respiratory effort. Lungs are clear to auscultation bilaterally. No wheezes, crackles, or rhonchi.  Gastrointestinal: Soft, non tender, and non distended with positive bowel sounds. No rebound or guarding. Musculoskeletal: Nontender with normal range of motion in all extremities. No edema, cyanosis, or erythema of extremities. Neurologic: Normal speech and language. Face is symmetric. Moving all extremities. No gross focal neurologic deficits are appreciated. Skin: Skin is warm, dry and intact. No rash noted. Psychiatric: Mood and affect are normal. Speech and behavior are normal.  ____________________________________________   LABS (all labs ordered are listed, but only abnormal results are displayed)  Labs Reviewed  COMPREHENSIVE METABOLIC PANEL - Abnormal; Notable for the following components:      Result Value   Glucose, Bld 105 (*)    All other components within normal limits  URINALYSIS, COMPLETE (UACMP) WITH MICROSCOPIC - Abnormal; Notable for the following components:   Color, Urine YELLOW (*)    APPearance HAZY (*)    Leukocytes,Ua TRACE (*)    Bacteria, UA RARE (*)    All other components within normal limits  SARS CORONAVIRUS 2 (TAT 6-24 HRS)  CBC WITH DIFFERENTIAL/PLATELET  INFLUENZA PANEL BY PCR (TYPE A & B)  POC URINE PREG, ED  POCT PREGNANCY, URINE   ____________________________________________  EKG  none  ____________________________________________  RADIOLOGY  none ____________________________________________   PROCEDURES  Procedure(s) performed: None Procedures Critical Care performed:  None ____________________________________________   INITIAL IMPRESSION / ASSESSMENT AND PLAN / ED COURSE   23 y.o. female who presents for evaluation of 2 days of sore throat, cough, congestion, body aches, fever.  Patient is extremely well-appearing, non toxic, in no distress with normal vital signs, normal work of  breathing, normal sats, lungs are clear to auscultation, abdomen is soft, no meningeal signs, bilateral shotty cervical lymphadenopathy, oropharynx is clear.  Labs with no significant abnormalities.  Differential diagnosis includes Covid versus flu versus viral illness. Will give ibuprofen for body aches, viscous lidocaine for sore throat.  Discussed quarantine with patient until results of the swabs is back.  Discussed quarantine and home care if the results are positive.  Discussed my standard return precautions and follow-up with PCP.       As part of my medical decision making, I reviewed the following data within the [place] notes reviewed and incorporated, Labs reviewed , Old chart reviewed, Notes from prior ED visits and [place] Controlled Substance Database   Patient was evaluated in Emergency Department today for the symptoms described in the history of present illness. Patient was evaluated in the context of the global COVID-19 pandemic, which necessitated consideration that the patient might be at risk for infection with the SARS-CoV-2 virus that causes COVID-19. Institutional protocols and  algorithms that pertain to the evaluation of patients at risk for COVID-19 are in a state of rapid change based on information released by regulatory bodies including the CDC and federal and state organizations. These policies and algorithms were followed during the patient's care in the ED.   ____________________________________________   FINAL CLINICAL IMPRESSION(S) / ED DIAGNOSES   Final diagnoses:  Viral URI with cough  Suspected COVID-19 virus infection      NEW MEDICATIONS STARTED DURING THIS VISIT:  ED Discharge Orders         Ordered    lidocaine (XYLOCAINE) 2 % solution  As needed     09/15/19 0532    ibuprofen (ADVIL) 600 MG tablet  Every 6 hours PRN     09/15/19 0532    ondansetron (ZOFRAN ODT) 4 MG disintegrating tablet  Every 8 hours PRN     09/15/19  0532           Note:  This document was prepared using Dragon voice recognition software and may include unintentional dictation errors.    Don Perking, [place], MD 09/15/19 0630

## 2019-09-15 NOTE — Discharge Instructions (Signed)

## 2019-09-15 NOTE — ED Triage Notes (Signed)
Patient to ER for c/o nasal congestion, neck pain, fever (unknown temp), and general malaise. Patient denies any chest pain or shortness of breath. States her nostrils burn when she takes a breath in.

## 2019-09-25 ENCOUNTER — Encounter: Payer: Self-pay | Admitting: Emergency Medicine

## 2019-09-25 ENCOUNTER — Other Ambulatory Visit: Payer: Self-pay

## 2019-09-25 ENCOUNTER — Emergency Department
Admission: EM | Admit: 2019-09-25 | Discharge: 2019-09-25 | Disposition: A | Discharge status: Home / Self Care | Payer: 59 | Attending: Emergency Medicine | Admitting: Emergency Medicine

## 2019-09-25 DIAGNOSIS — N3090 Cystitis, unspecified without hematuria: Secondary | ICD-10-CM | POA: Insufficient documentation

## 2019-09-25 DIAGNOSIS — R300 Dysuria: Secondary | ICD-10-CM | POA: Diagnosis present

## 2019-09-25 DIAGNOSIS — Z79899 Other long term (current) drug therapy: Secondary | ICD-10-CM | POA: Diagnosis not present

## 2019-09-25 LAB — URINALYSIS, COMPLETE (UACMP) WITH MICROSCOPIC
Bilirubin Urine: NEGATIVE
Glucose, UA: NEGATIVE mg/dL
Ketones, ur: NEGATIVE mg/dL
Nitrite: NEGATIVE
Protein, ur: 100 mg/dL — AB
RBC / HPF: >50 RBC/hpf — ABNORMAL HIGH (ref 0–5)
Specific Gravity, Urine: 1.015 (ref 1.005–1.030)
WBC, UA: >50 WBC/hpf — ABNORMAL HIGH (ref 0–5)
pH: 7 (ref 5.0–8.0)

## 2019-09-25 LAB — POCT PREGNANCY, URINE: Preg Test, Ur: NEGATIVE

## 2019-09-25 MED ORDER — SULFAMETHOXAZOLE-TRIMETHOPRIM 800-160 MG PO TABS: 1.0000 | ORAL_TABLET | Freq: Two times a day (BID) | ORAL | 0 refills | Status: AC

## 2019-09-25 MED ORDER — SULFAMETHOXAZOLE-TRIMETHOPRIM 800-160 MG PO TABS
1.0000 | ORAL_TABLET | Freq: Once | ORAL | Status: AC
  Administered 2019-09-25: 1 via ORAL
  Filled 2019-09-25: qty 1

## 2019-09-25 NOTE — ED Provider Notes (Signed)
[facility] Emergency Department Provider Note  ____________________________________________  Time seen: Approximately 5:31 PM  I have reviewed the triage vital signs and the nursing notes.   HISTORY  Chief Complaint Dysuria    HPI Brandi Pearson is a 23 y.o. female presents to the emergency department with dysuria, hematuria and increased urinary frequency that started last night.  Patient denies fever or chills at home.  She had some mild right-sided low back pain but no nausea or vomiting.  She states that she has no concerns for STDs and recently underwent STD testing.  She states that she is not urinating after intercourse regularly.  No dyspareunia.  No other alleviating measures have been attempted.        Past Medical History:  Diagnosis Date  . Constipation   . Medical history non-contributory     Patient Active Problem List   Diagnosis Date Noted  . MDD (major depressive disorder), recurrent episode, severe (HCC) 01/11/2013  . PTSD (post-traumatic stress disorder) 01/11/2013  . 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 NO PAST SURGERIES      Prior to Admission medications   Medication Sig Start Date End Date Taking? Authorizing Provider  buPROPion (WELLBUTRIN XL) 150 MG 24 hr tablet Take 1 tablet (150 mg total) by mouth daily. 01/15/13   Winson, Louie Bun, NP  ibuprofen (ADVIL) 600 MG tablet Take 1 tablet (600 mg total) by mouth every 6 (six) hours as needed. 09/15/19   Don Perking, [place], MD  lidocaine (XYLOCAINE) 2 % solution Use as directed 15 mLs in the mouth or throat as needed for mouth pain. 09/15/19   Nita Sickle, MD  mirtazapine (REMERON) 15 MG tablet Take 1 tablet (15 mg total) by mouth at bedtime. 01/15/13   Winson, Louie Bun, NP  ondansetron (ZOFRAN ODT) 4 MG disintegrating tablet Take 1 tablet (4 mg total) by mouth every 8 (eight) hours as needed. 09/15/19   Nita Sickle,  MD  sulfamethoxazole-trimethoprim (BACTRIM DS) 800-160 MG tablet Take 1 tablet by mouth 2 (two) times daily for 7 days. 09/25/19 10/02/19  Orvil Feil, PA-C    Allergies Patient has no known allergies.  No family history on file.  Social History Social History   Tobacco Use  . Smoking status: Never Smoker  . Smokeless tobacco: Never Used  Substance Use Topics  . Alcohol use: No  . Drug use: Not Currently     Review of Systems  Constitutional: No fever/chills Eyes: No visual changes. No discharge ENT: No upper respiratory complaints. Cardiovascular: no chest pain. Respiratory: no cough. No SOB. Gastrointestinal: No abdominal pain.  No nausea, no vomiting.  No diarrhea.  No constipation. Genitourinary: Patient has dysuria, hematuria and increased urinary frequency. Musculoskeletal: Negative for musculoskeletal pain. Skin: Negative for rash, abrasions, lacerations, ecchymosis. Neurological: Negative for headaches, focal weakness or numbness.   ____________________________________________   PHYSICAL EXAM:  VITAL SIGNS: ED Triage Vitals  Enc Vitals Group     BP 09/25/19 1551 (!) 153/98     Pulse Rate 09/25/19 1551 81     Resp 09/25/19 1551 16     Temp 09/25/19 1551 99.1 F (37.3 C)     Temp Source 09/25/19 1551 Oral     SpO2 09/25/19 1551 99 %     Weight 09/25/19 1551 180 lb (81.6 kg)     Height 09/25/19 1551 5\' 4"  (1.626 m)     Head Circumference --  Peak Flow --      Pain Score 09/25/19 1556 10     Pain Loc --      Pain Edu? --      Excl. in [place]? --      Constitutional: Alert and oriented. Well appearing and in no acute distress. Eyes: Conjunctivae are normal. PERRL. EOMI. Head: Atraumatic. ENT: Cardiovascular: Normal rate, regular rhythm. Normal S1 and S2.  Good peripheral circulation. Respiratory: Normal respiratory effort without tachypnea or retractions. Lungs CTAB. Good air entry to the bases with no decreased or absent breath  sounds. Gastrointestinal: Bowel sounds 4 quadrants. Soft and nontender to palpation. No guarding or rigidity. No palpable masses. No distention. No CVA tenderness. Musculoskeletal: Full range of motion to all extremities. No gross deformities appreciated. Neurologic:  Normal speech and language. No gross focal neurologic deficits are appreciated.  Skin:  Skin is warm, dry and intact. No rash noted. Psychiatric: Mood and affect are normal. Speech and behavior are normal. Patient exhibits appropriate insight and judgement.   ____________________________________________   LABS (all labs ordered are listed, but only abnormal results are displayed)  Labs Reviewed  URINALYSIS, COMPLETE (UACMP) WITH MICROSCOPIC - Abnormal; Notable for the following components:      Result Value   Color, Urine YELLOW (*)    APPearance CLOUDY (*)    Hgb urine dipstick MODERATE (*)    Protein, ur 100 (*)    Leukocytes,Ua LARGE (*)    RBC / HPF >50 (*)    WBC, UA >50 (*)    Bacteria, UA RARE (*)    All other components within normal limits  POCT PREGNANCY, URINE   ____________________________________________  EKG   ____________________________________________  RADIOLOGY   No results found.  ____________________________________________    PROCEDURES  Procedure(s) performed:    Procedures    Medications  sulfamethoxazole-trimethoprim (BACTRIM DS) 800-160 MG per tablet 1 tablet (has no administration in time range)     ____________________________________________   INITIAL IMPRESSION / ASSESSMENT AND PLAN / ED COURSE  Pertinent labs & imaging results that were available during my care of the patient were reviewed by me and considered in my medical decision making (see chart for details).  Review of the [place] CSRS was performed in accordance of the [place] prior to dispensing any controlled drugs.           Assessment and plan Cystitis 23 year old female presents to the emergency  department with dysuria, hematuria and increased urinary frequency for 1 day.  Patient was hypertensive at triage but vital signs were otherwise reassuring.  She had no significant CVA tenderness on exam. Differential diagnosis included cystitis, pyelonephritis, nephrolithiasis and STD.Marland KitchenMarland Kitchen  Urinalysis was concerning for cystitis with a large amount of leuks and a moderate amount of blood.  Patient had no significant CVA tenderness, severe low back pain or nausea, decreasing suspicion for pyelonephritis and nephrolithiasis.  Will treat empirically for cystitis at this time with Bactrim as patient recently finished a course of Keflex.  Patient education was provided about consistent urination after intercourse to prevent chronic cystitis.  Patient voiced understanding.    ____________________________________________  FINAL CLINICAL IMPRESSION(S) / ED DIAGNOSES  Final diagnoses:  Cystitis      NEW MEDICATIONS STARTED DURING THIS VISIT:  ED Discharge Orders         Ordered    sulfamethoxazole-trimethoprim (BACTRIM DS) 800-160 MG tablet  2 times daily     09/25/19 1728  This chart was dictated using voice recognition software/Dragon. Despite best efforts to proofread, errors can occur which can change the meaning. Any change was purely unintentional.    Orvil FeilWoods, Alexandr Oehler M, PA-C 09/25/19 1735    Emily FilbertWilliams, Jonathan E, MD 09/25/19 1754

## 2019-09-25 NOTE — ED Notes (Addendum)
Pt presents to the ED c/o burning during urination and vaginal pain. Pt states that this started last night. She says that she can't even sit comfortably because of her vaginal pain. Pt states that she does not have any vaginal discharge. Pt has no other complaints and NAD at this time. Pt denies CP, SOB, fever, or NVD.

## 2019-09-25 NOTE — ED Triage Notes (Signed)
Frequency, urgency and blood in urine since yesterday.

## 2019-12-31 ENCOUNTER — Ambulatory Visit (INDEPENDENT_AMBULATORY_CARE_PROVIDER_SITE_OTHER): Payer: Medicaid Other | Admitting: Certified Nurse Midwife

## 2019-12-31 ENCOUNTER — Other Ambulatory Visit: Payer: Self-pay

## 2019-12-31 ENCOUNTER — Encounter: Payer: Self-pay | Admitting: Certified Nurse Midwife

## 2019-12-31 ENCOUNTER — Other Ambulatory Visit (HOSPITAL_COMMUNITY)
Admission: RE | Admit: 2019-12-31 | Discharge: 2019-12-31 | Disposition: A | Discharge status: Home / Self Care | Payer: 59 | Source: Ambulatory Visit | Attending: Certified Nurse Midwife | Admitting: Certified Nurse Midwife

## 2019-12-31 VITALS — BP 100/73 | HR 62 | Ht 64.0 in | Wt 199.2 lb

## 2019-12-31 DIAGNOSIS — N912 Amenorrhea, unspecified: Secondary | ICD-10-CM | POA: Diagnosis not present

## 2019-12-31 DIAGNOSIS — L678 Other hair color and hair shaft abnormalities: Secondary | ICD-10-CM

## 2019-12-31 DIAGNOSIS — E669 Obesity, unspecified: Secondary | ICD-10-CM

## 2019-12-31 DIAGNOSIS — Z01419 Encounter for gynecological examination (general) (routine) without abnormal findings: Secondary | ICD-10-CM

## 2019-12-31 DIAGNOSIS — Z124 Encounter for screening for malignant neoplasm of cervix: Secondary | ICD-10-CM

## 2019-12-31 DIAGNOSIS — Z0000 Encounter for general adult medical examination without abnormal findings: Secondary | ICD-10-CM

## 2019-12-31 DIAGNOSIS — Z6834 Body mass index (BMI) 34.0-34.9, adult: Secondary | ICD-10-CM

## 2019-12-31 DIAGNOSIS — R102 Pelvic and perineal pain: Secondary | ICD-10-CM | POA: Insufficient documentation

## 2019-12-31 NOTE — Progress Notes (Signed)
Patient c/o constant pelvic pain that goes down to her legs x1 year, had no menstrual period from [place], had normal menstrual period this month.

## 2019-12-31 NOTE — Patient Instructions (Signed)
Secondary Amenorrhea  Secondary amenorrhea occurs when a female who was previously having menstrual periods has not had them for 3-6 months. A menstrual period is the monthly shedding of the lining of the uterus. Menstruation involves the passing of blood, tissue, fluid, and mucus through the vagina. The flow of blood usually occurs during 3-7 consecutive days each month. This condition has many causes. In many cases, treating the underlying cause will return menstrual periods back to a normal cycle. What are the causes? The most common cause of this condition is pregnancy. Other causes include:  Malnutrition.  Cirrhosis of the liver.  Conditions of the blood.  Diabetes.  Epilepsy.  Chronic kidney disease.  Polycystic ovary disease.  Stress or anxiety.  A hormonal imbalance.  Ovarian failure.  Medicines.  Extreme obesity.  Cystic fibrosis.  Low body weight or drastic weight loss.  Early menopause.  Removal of the ovaries or uterus.  Contraceptive pills, patches, or vaginal rings.  Cushing syndrome.  Thyroid problems. What increases the risk? You are more likely to develop this condition if:  You have a family history of this condition.  You have an eating disorder.  You do extreme athletic training.  You have a chronic disease.  You abuse substances such as alcohol or cigarettes. What are the signs or symptoms? The main symptom of this condition is a lack of menstrual periods for 3-6 months. How is this diagnosed? This condition may be diagnosed based on:  Your medical history.  A physical exam.  A pelvic exam to check for problems with your reproductive organs.  A procedure to examine the uterus.  A measurement of your body mass index (BMI).  Tests, such as: ? Blood tests that measure certain hormones in your body and rule out pregnancy. ? Urine tests. ? Imaging tests, such as an ultrasound, CT scan, or MRI. How is this treated? Treatment  for this condition depends on the cause of the amenorrhea. It may involve:  Correcting dietary problems.  Treating underlying conditions.  Medicines.  Lifestyle changes.  Surgery. If the condition cannot be corrected, it is sometimes possible to trigger menstrual periods with medicines. Follow these instructions at home: Lifestyle  Maintain a healthy diet. Ask to meet with a registered dietitian for nutrition counseling and meal planning.  Maintain a healthy weight. Talk to your health care provider before trying any new diet or exercise plan.  Exercise at least 30 minutes 5 or more days each week. Exercising includes brisk walking, yard work, biking, running, swimming, and team sports like basketball and soccer. Ask your health care provider which exercises are safe for you.  Get enough sleep. Plan your sleep time to allow for 7-9 hours of sleep each night.  Learn to manage stress. Explore relaxation techniques such as meditation, journaling, yoga, or tai chi. General instructions  Be aware of changes in your menstrual cycle. Keep a record of when you have your menstrual period. Note the date your period starts, how long it lasts, and any problems you experience.  Take over-the-counter and prescription medicines only as told by your health care provider.  Keep all follow-up visits as told by your health care provider. This is important. Contact a health care provider if:  Your periods do not return to normal after treatment. Summary  Secondary amenorrhea is when a female who was previously having menstrual periods has not gotten her period for 3-6 months.  This condition has many causes. In many cases, treating the underlying   cause will return menstrual periods back to a normal cycle.  Talk to your health care provider if your periods do not return to normal after treatment. This information is not intended to replace advice given to you by your health care provider. Make  sure you discuss any questions you have with your health care provider. Document Revised: 04/06/2019 Document Reviewed: 01/09/2017 Elsevier Patient Education  [place]. Pelvic Pain, Female Pelvic pain is pain in your lower belly (abdomen), below your belly button and between your hips. The pain may start suddenly (be acute), keep coming back (be recurring), or last a long time (become chronic). Pelvic pain that lasts longer than 6 months is called chronic pelvic pain. There are many causes of pelvic pain. Sometimes the cause of pelvic pain is not known. Follow these instructions at home:   Take over-the-counter and prescription medicines only as told by your doctor.  Rest as told by your doctor.  Do not have sex if it hurts.  Keep a journal of your pelvic pain. Write down: ? When the pain started. ? Where the pain is located. ? What seems to make the pain better or worse, such as food or your period (menstrual cycle). ? Any symptoms you have along with the pain.  Keep all follow-up visits as told by your doctor. This is important. Contact a doctor if:  Medicine does not help your pain.  Your pain comes back.  You have new symptoms.  You have unusual discharge or bleeding from your vagina.  You have a fever or chills.  You are having trouble pooping (constipation).  You have blood in your pee (urine) or poop (stool).  Your pee smells bad.  You feel weak or light-headed. Get help right away if:  You have sudden pain that is very bad.  Your pain keeps getting worse.  You have very bad pain and also have any of these symptoms: ? A fever. ? Feeling sick to your stomach (nausea). ? Throwing up (vomiting). ? Being very sweaty.  You pass out (lose consciousness). Summary  Pelvic pain is pain in your lower belly (abdomen), below your belly button and between your hips.  There are many possible causes of pelvic pain.  Keep a journal of your pelvic  pain. This information is not intended to replace advice given to you by your health care provider. Make sure you discuss any questions you have with your health care provider. Document Revised: 04/08/2018 Document Reviewed: 04/08/2018 Elsevier Patient Education  2020 [place] 79-35 Years Old, Female Preventive care refers to visits with your health care provider and lifestyle choices that can promote health and wellness. This includes:  A yearly physical exam. This may also be called an annual well check.  Regular dental visits and eye exams.  Immunizations.  Screening for certain conditions.  Healthy lifestyle choices, such as eating a healthy diet, getting regular exercise, not using drugs or products that contain nicotine and tobacco, and limiting alcohol use. What can I expect for my preventive care visit? Physical exam Your health care provider will check your:  Height and weight. This may be used to calculate body mass index (BMI), which tells if you are at a healthy weight.  Heart rate and blood pressure.  Skin for abnormal spots. Counseling Your health care provider may ask you questions about your:  Alcohol, tobacco, and drug use.  Emotional well-being.  Home and relationship well-being.  Sexual activity.  Eating habits.  Work and work Statistician.  Method of birth control.  Menstrual cycle.  Pregnancy history. What immunizations do I need?  Influenza (flu) vaccine  This is recommended every year. Tetanus, diphtheria, and pertussis (Tdap) vaccine  You may need a Td booster every 10 years. Varicella (chickenpox) vaccine  You may need this if you have not been vaccinated. Human papillomavirus (HPV) vaccine  If recommended by your health care provider, you may need three doses over 6 months. Measles, mumps, and rubella (MMR) vaccine  You may need at least one dose of MMR. You may also need a second dose. Meningococcal  conjugate (MenACWY) vaccine  One dose is recommended if you are age 76-21 years and a first-year college student living in a residence hall, or if you have one of several medical conditions. You may also need additional booster doses. Pneumococcal conjugate (PCV13) vaccine  You may need this if you have certain conditions and were not previously vaccinated. Pneumococcal polysaccharide (PPSV23) vaccine  You may need one or two doses if you smoke cigarettes or if you have certain conditions. Hepatitis A vaccine  You may need this if you have certain conditions or if you travel or work in places where you may be exposed to hepatitis A. Hepatitis B vaccine  You may need this if you have certain conditions or if you travel or work in places where you may be exposed to hepatitis B. Haemophilus influenzae type b (Hib) vaccine  You may need this if you have certain conditions. You may receive vaccines as individual doses or as more than one vaccine together in one shot (combination vaccines). Talk with your health care provider about the risks and benefits of combination vaccines. What tests do I need?  Blood tests  Lipid and cholesterol levels. These may be checked every 5 years starting at age 76.  Hepatitis C test.  Hepatitis B test. Screening  Diabetes screening. This is done by checking your blood sugar (glucose) after you have not eaten for a while (fasting).  Sexually transmitted disease (STD) testing.  BRCA-related cancer screening. This may be done if you have a family history of breast, ovarian, tubal, or peritoneal cancers.  Pelvic exam and Pap test. This may be done every 3 years starting at age 51. Starting at age 59, this may be done every 5 years if you have a Pap test in combination with an HPV test. Talk with your health care provider about your test results, treatment options, and if necessary, the need for more tests. Follow these instructions at home: Eating and  drinking   Eat a diet that includes fresh fruits and vegetables, whole grains, lean protein, and low-fat dairy.  Take vitamin and mineral supplements as recommended by your health care provider.  Do not drink alcohol if: ? Your health care provider tells you not to drink. ? You are pregnant, may be pregnant, or are planning to become pregnant.  If you drink alcohol: ? Limit how much you have to 0-1 drink a day. ? Be aware of how much alcohol is in your drink. In the [place] one drink equals one 12 oz bottle of beer (355 mL), one 5 oz glass of wine (148 mL), or one 1 oz glass of hard liquor (44 mL). Lifestyle  Take daily care of your teeth and gums.  Stay active. Exercise for at least 30 minutes on 5 or more days each week.  Do not use any products that contain nicotine or tobacco, such  as cigarettes, e-cigarettes, and chewing tobacco. If you need help quitting, ask your health care provider.  If you are sexually active, practice safe sex. Use a condom or other form of birth control (contraception) in order to prevent pregnancy and STIs (sexually transmitted infections). If you plan to become pregnant, see your health care provider for a preconception visit. What's next?  Visit your health care provider once a year for a well check visit.  Ask your health care provider how often you should have your eyes and teeth checked.  Stay up to date on all vaccines. This information is not intended to replace advice given to you by your health care provider. Make sure you discuss any questions you have with your health care provider. Document Revised: 07/02/2018 Document Reviewed: 07/02/2018 Elsevier Patient Education  2020 [employer].

## 2020-01-01 ENCOUNTER — Encounter: Payer: Self-pay | Admitting: Certified Nurse Midwife

## 2020-01-01 NOTE — Progress Notes (Signed)
ANNUAL PREVENTATIVE CARE GYN  ENCOUNTER NOTE  Subjective:       Brandi Pearson is a 24 y.o. G0P0000 female here for a routine annual gynecologic exam.  Current complaints:  1. No menses from October through January  2. Constant pelvic pain that radiates down front of thighs for the last year  3. Excessive facial hair  Denies difficulty breathing or respiratory distress, chest pain, dysuria, and leg pain or swelling.    Gynecologic History  Patient's last menstrual period was 12/17/2019 (approximate). Period Cycle (Days): 28 Period Duration (Days): 7 Period Pattern: Regular Menstrual Flow: Heavy Menstrual Control: Maxi pad Dysmenorrhea: (!) Severe Dysmenorrhea Symptoms: Cramping  Contraception: condoms  Last Pap: due  Obstetric History  OB History  Gravida Para Term Preterm AB Living  0 0 0 0 0 0  SAB TAB Ectopic Multiple Live Births  0 0 0 0 0    Past Medical History:  Diagnosis Date  . Constipation   . Medical history non-contributory     Past Surgical History:  Procedure Laterality Date  . NO PAST SURGERIES      Current Outpatient Medications on File Prior to Visit  Medication Sig Dispense Refill  . ibuprofen (ADVIL) 600 MG tablet Take 1 tablet (600 mg total) by mouth every 6 (six) hours as needed. 20 tablet 0   No current facility-administered medications on file prior to visit.    Allergies  Allergen Reactions  . Shellfish Allergy Rash    Social History   Socioeconomic History  . Marital status: Single    Spouse name: Not on file  . Number of children: Not on file  . Years of education: Not on file  . Highest education level: Not on file  Occupational History  . Occupation: Consulting civil engineer      Tobacco Use  . Smoking status: Never Smoker  . Smokeless tobacco: Never Used  Substance and Sexual Activity  . Alcohol use: No  . Drug use: Not Currently    Types: Marijuana  . Sexual activity: Not Currently    Partners: Male    Birth  control/protection: None  Other Topics Concern  . Not on file  Social History Narrative  . Not on file   Social Determinants of Health   Financial Resource Strain:   . Difficulty of Paying Living Expenses: Not on file  Food Insecurity:   . Worried About Programme researcher, broadcasting/film/video in the Last Year: Not on file  . Ran Out of Food in the Last Year: Not on file  Transportation Needs:   . Lack of Transportation (Medical): Not on file  . Lack of Transportation (Non-Medical): Not on file  Physical Activity:   . Days of Exercise per Week: Not on file  . Minutes of Exercise per Session: Not on file  Stress:   . Feeling of Stress : Not on file  Social Connections:   . Frequency of Communication with Friends and Family: Not on file  . Frequency of Social Gatherings with Friends and Family: Not on file  . Attends Religious Services: Not on file  . Active Member of Clubs or Organizations: Not on file  . Attends Banker Meetings: Not on file  . Marital Status: Not on file  Intimate Partner Violence:   . Fear of Current or Ex-Partner: Not on file  . Emotionally Abused: Not on file  . Physically Abused: Not on file  . Sexually Abused: Not on file    Family History  Problem Relation Age of Onset  . Colon cancer Paternal Grandfather   . Stroke Paternal Grandfather   . Breast cancer Neg Hx   . Ovarian cancer Neg Hx     The following portions of the patient's history were reviewed and updated as appropriate: allergies, current medications, past family history, past medical history, past social history, past surgical history and problem list.  Review of Systems  ROS negative except as noted above. Information obtained from patient.    Objective:   BP 100/73   Pulse 62   Ht 5\' 4"  (1.626 m)   Wt 199 lb 3.2 oz (90.4 kg)   LMP 12/17/2019 (Approximate)   BMI 34.19 kg/m    CONSTITUTIONAL: Well-developed, well-nourished female in no acute distress.   PSYCHIATRIC: Normal mood  and affect. Normal behavior. Normal judgment and thought content.  NEUROLGIC: Alert and oriented to person, place, and time. Normal muscle tone coordination. No cranial nerve deficit noted.  HENT:  Normocephalic, atraumatic, External right and left ear normal.  EYES: Conjunctivae and EOM are normal. Pupils are equal and round.   NECK: Normal range of motion, supple, no masses.  Normal thyroid.   SKIN: Skin is warm and dry. No rash noted. Not diaphoretic. No erythema. No pallor.  CARDIOVASCULAR: Normal heart rate noted, regular rhythm, no murmur.  RESPIRATORY: Clear to auscultation bilaterally. Effort and breath sounds normal, no problems with respiration noted.  BREASTS: Symmetric in size. No masses, skin changes, nipple drainage, or lymphadenopathy.  ABDOMEN: Soft, normal bowel sounds, no distention noted.  No tenderness, rebound or guarding.   PELVIC:  External Genitalia: Normal  Vagina: Normal  Cervix: Normal, Pap collected  Uterus: Normal  Adnexa: Normal    MUSCULOSKELETAL: Normal range of motion. No tenderness.  No cyanosis, clubbing, or edema.  2+ distal pulses.  LYMPHATIC: No Axillary, Supraclavicular, or Inguinal Adenopathy.  Assessment:   Annual gynecologic examination 24 y.o.   Contraception: condoms   Obesity 1   Problem List Items Addressed This Visit    None    Visit Diagnoses    Well woman exam    -  Primary   Relevant Orders   Cytology - PAP   Cervicovaginal ancillary only   Prolactin   DHEA-sulfate   Testosterone, Free, Total, SHBG   Progesterone   Thyroid Panel With TSH   FSH/LH   Hemoglobin A1c   Lipid panel   36 PELVIS TRANSVAGINAL NON-OB (TV ONLY)   US PELVIS (TRANSABDOMINAL ONLY)   Screening for cervical cancer       Relevant Orders   Cytology - PAP   Pelvic pain       Relevant Orders   Cervicovaginal ancillary only   US PELVIS TRANSVAGINAL NON-OB (TV ONLY)   US PELVIS (TRANSABDOMINAL ONLY)   Amenorrhea       Relevant Orders    Cervicovaginal ancillary only   Prolactin   DHEA-sulfate   Testosterone, Free, Total, SHBG   Progesterone   Thyroid Panel With TSH   FSH/LH   Hemoglobin A1c   Lipid panel   Abnormal facial hair       Relevant Orders   Prolactin   DHEA-sulfate   Testosterone, Free, Total, SHBG   Progesterone   Thyroid Panel With TSH   FSH/LH   Class 1 obesity with body mass index (BMI) of 34.0 to 34.9 in adult, unspecified obesity type, unspecified whether serious comorbidity present       Relevant Orders   Testosterone, Free, Total, SHBG  Thyroid Panel With TSH   Hemoglobin A1c   Lipid panel      Plan:   Pap: Pap, Reflex if ASCUS  Labs: See orders  Routine preventative health maintenance measures emphasized: Exercise/Diet/Weight control, Tobacco Warnings, Alcohol/Substance use risks and Stress Management; see AVS  Reviewed red flag symptoms and when to call  RTC for labs and ultrasound followed by review  RTC x 1 year for ANNUAL EXAM or sooner if needed   Diona Fanti, CNM [facility], [facility] 01/01/20 1:50 PM

## 2020-01-04 ENCOUNTER — Other Ambulatory Visit: Payer: Self-pay

## 2020-01-04 ENCOUNTER — Ambulatory Visit (INDEPENDENT_AMBULATORY_CARE_PROVIDER_SITE_OTHER): Payer: 59

## 2020-01-04 ENCOUNTER — Other Ambulatory Visit: Payer: Medicaid Other

## 2020-01-04 DIAGNOSIS — R102 Pelvic and perineal pain: Secondary | ICD-10-CM

## 2020-01-04 DIAGNOSIS — Z01419 Encounter for gynecological examination (general) (routine) without abnormal findings: Secondary | ICD-10-CM

## 2020-01-04 LAB — CERVICOVAGINAL ANCILLARY ONLY
Bacterial Vaginitis (gardnerella): NEGATIVE
Candida Glabrata: NEGATIVE
Candida Vaginitis: NEGATIVE
Chlamydia: NEGATIVE
Comment: NEGATIVE
Comment: NEGATIVE
Comment: NEGATIVE
Comment: NEGATIVE
Comment: NEGATIVE
Comment: NORMAL
Neisseria Gonorrhea: NEGATIVE
Trichomonas: NEGATIVE

## 2020-01-04 LAB — CYTOLOGY - PAP: Diagnosis: NEGATIVE

## 2020-01-06 LAB — FSH/LH
FSH: 5.1 m[IU]/mL
LH: 16.9 m[IU]/mL

## 2020-01-06 LAB — THYROID PANEL WITH TSH
Free Thyroxine Index: 1.7 (ref 1.2–4.9)
T3 Uptake Ratio: 21 % — ABNORMAL LOW (ref 24–39)
T4, Total: 7.9 ug/dL (ref 4.5–12.0)
TSH: 2.67 u[IU]/mL (ref 0.450–4.500)

## 2020-01-06 LAB — LIPID PANEL
Chol/HDL Ratio: 3.5 ratio (ref 0.0–4.4)
Cholesterol, Total: 157 mg/dL (ref 100–199)
HDL: 45 mg/dL (ref >39)
LDL Chol Calc (NIH): 99 mg/dL (ref 0–99)
Triglycerides: 65 mg/dL (ref 0–149)
VLDL Cholesterol Cal: 13 mg/dL (ref 5–40)

## 2020-01-06 LAB — PROGESTERONE: Progesterone: 2.3 ng/mL

## 2020-01-06 LAB — TESTOSTERONE, FREE, TOTAL, SHBG
Sex Hormone Binding: 46.7 nmol/L (ref 24.6–122.0)
Testosterone, Free: 4.1 pg/mL (ref 0.0–4.2)
Testosterone: 53 ng/dL — ABNORMAL HIGH (ref 8–48)

## 2020-01-06 LAB — HEMOGLOBIN A1C
Est. average glucose Bld gHb Est-mCnc: 88 mg/dL
Hgb A1c MFr Bld: 4.7 % — ABNORMAL LOW (ref 4.8–5.6)

## 2020-01-06 LAB — DHEA-SULFATE: DHEA-SO4: 206 ug/dL (ref 110.0–431.7)

## 2020-01-06 LAB — PROLACTIN: Prolactin: 15.4 ng/mL (ref 4.8–23.3)

## 2020-01-14 ENCOUNTER — Encounter: Payer: Self-pay | Admitting: Certified Nurse Midwife

## 2020-01-14 ENCOUNTER — Other Ambulatory Visit: Payer: Self-pay

## 2020-01-14 ENCOUNTER — Ambulatory Visit (INDEPENDENT_AMBULATORY_CARE_PROVIDER_SITE_OTHER): Payer: 59 | Admitting: Certified Nurse Midwife

## 2020-01-14 VITALS — BP 100/68 | HR 69 | Ht 64.0 in | Wt 201.1 lb

## 2020-01-14 DIAGNOSIS — N946 Dysmenorrhea, unspecified: Secondary | ICD-10-CM | POA: Diagnosis not present

## 2020-01-14 DIAGNOSIS — R102 Pelvic and perineal pain: Secondary | ICD-10-CM

## 2020-01-14 NOTE — Progress Notes (Signed)
Patient here to discuss 3/2 ultrasound report.  Patient c/o intermittent pelvic pain x3 years, takes Ibuprofen with some relief.

## 2020-01-14 NOTE — Patient Instructions (Signed)
Dysmenorrhea Dysmenorrhea means painful cramps during your period (menstrual period). You will have pain in your lower belly (abdomen). The pain is caused by the tightening (contracting) of the muscles of the womb (uterus). The pain may be mild or very bad. With this condition, you may:  Have a headache.  Feel sick to your stomach (nauseous).  Throw up (vomit).  Have lower back pain. Follow these instructions at home: Helping pain and cramping   Put heat on your lower back or belly when you have pain or cramps. Use the heat source that your doctor tells you to use. ? Place a towel between your skin and the heat. ? Leave the heat on for 20-30 minutes. ? Remove the heat if your skin turns bright red. This is especially important if you cannot feel pain, heat, or cold. ? Do not have a heating pad on during sleep.  Do aerobic exercises. These include walking, swimming, or biking. These may help with cramps.  Massage your lower back or belly. This may help lessen pain. General instructions  Take over-the-counter and prescription medicines only as told by your doctor.  Do not drive or use heavy machinery while taking prescription pain medicine.  Avoid alcohol and caffeine during and right before your period. These can make cramps worse.  Do not use any products that have nicotine or tobacco. These include cigarettes and e-cigarettes. If you need help quitting, ask your doctor.  Keep all follow-up visits as told by your doctor. This is important. Contact a doctor if:  You have pain that gets worse.  You have pain that does not get better with medicine.  You have pain during sex.  You feel sick to your stomach or you throw up during your period, and medicine does not help. Get help right away if:  You pass out (faint). Summary  Dysmenorrhea means painful cramps during your period (menstrual period).  Put heat on your lower back or belly when you have pain or cramps.  Do  exercises like walking, swimming, or biking to help with cramps.  Contact a doctor if you have pain during sex. This information is not intended to replace advice given to you by your health care provider. Make sure you discuss any questions you have with your health care provider. Document Revised: 10/03/2017 Document Reviewed: 11/07/2016 Elsevier Patient Education  [place].   Pelvic Pain, Female Pelvic pain is pain in your lower belly (abdomen), below your belly button and between your hips. The pain may start suddenly (be acute), keep coming back (be recurring), or last a long time (become chronic). Pelvic pain that lasts longer than 6 months is called chronic pelvic pain. There are many causes of pelvic pain. Sometimes the cause of pelvic pain is not known. Follow these instructions at home:   Take over-the-counter and prescription medicines only as told by your doctor.  Rest as told by your doctor.  Do not have sex if it hurts.  Keep a journal of your pelvic pain. Write down: ? When the pain started. ? Where the pain is located. ? What seems to make the pain better or worse, such as food or your period (menstrual cycle). ? Any symptoms you have along with the pain.  Keep all follow-up visits as told by your doctor. This is important. Contact a doctor if:  Medicine does not help your pain.  Your pain comes back.  You have new symptoms.  You have unusual discharge or bleeding  from your vagina.  You have a fever or chills.  You are having trouble pooping (constipation).  You have blood in your pee (urine) or poop (stool).  Your pee smells bad.  You feel weak or light-headed. Get help right away if:  You have sudden pain that is very bad.  Your pain keeps getting worse.  You have very bad pain and also have any of these symptoms: ? A fever. ? Feeling sick to your stomach (nausea). ? Throwing up (vomiting). ? Being very sweaty.  You pass out (lose  consciousness). Summary  Pelvic pain is pain in your lower belly (abdomen), below your belly button and between your hips.  There are many possible causes of pelvic pain.  Keep a journal of your pelvic pain. This information is not intended to replace advice given to you by your health care provider. Make sure you discuss any questions you have with your health care provider. Document Revised: 04/08/2018 Document Reviewed: 04/08/2018 Elsevier Patient Education  [place].   Pelvic Inflammatory Disease  Pelvic inflammatory disease (PID) is an infection in some or all of the female reproductive organs. PID can be in the womb (uterus), ovaries, fallopian tubes, or nearby tissues that are inside the lower belly area (pelvis). PID can lead to problems if it is not treated. What are the causes?  Germs (bacteria) that are spread during sex. This is the most common cause.  Germs in the vagina that are not spread during sex.  Germs that travel up from the vagina or cervix to the reproductive organs after: ? The birth of a baby. ? A miscarriage. ? An abortion. ? Pelvic surgery. ? Insertion of an intrauterine device (IUD). ? A sexual assault. What increases the risk?  Being younger than 22 years old.  Having sex at a young age.  Having a history of STI (sexually transmitted infection) or PID.  Not using barrier birth control, such as condoms.  Having a lot of sex partners.  Having sex with someone who has symptoms of an STI.  Using a douche.  Having an IUD put in place. What are the signs or symptoms?  Pain in the belly area.  Fever.  Chills.  Discharge from the vagina that is not normal.  Bleeding from the womb that is not normal.  Pain soon after the end of a menstrual period.  Pain when you pee (urinate).  Pain with sex.  Feeling sick to your stomach (nauseous) or throwing up (vomiting). How is this treated?  Antibiotic medicines. In very bad cases,  these may be given through an IV tube.  Surgery. This is rare.  Efforts to stop the spread of the infection. Sex partners may need to be treated. It may take weeks until you feel all better. Your doctor may test you for infection again after you finish treatment. You should also be checked for HIV (human immunodeficiency virus). Follow these instructions at home:  Take over-the-counter and prescription medicines only as told by your doctor.  If you were prescribed an antibiotic medicine, take it as told by your doctor. Do not stop taking it even if you start to feel better.  Do not have sex until treatment is done or as told by your doctor.  Tell your sex partner if you have PID. Your partner may need to be treated.  Keep all follow-up visits as told by your doctor. This is important. Contact a doctor if:  You have more fluid or  fluid that is not normal coming from your vagina.  Your pain does not improve.  You throw up.  You have a fever.  You cannot take your medicines.  Your partner has an STI.  You have pain when you pee. Get help right away if:  You have more pain in the belly area.  You have chills.  You are not better in 72 hours with treatment. Summary  Pelvic inflammatory disease (PID) is caused by an infection in some or all of the female reproductive organs.  PID is a serious infection.  This infection is most often treated with antibiotics.  Do not have sex until treatment is done or as told by your doctor. This information is not intended to replace advice given to you by your health care provider. Make sure you discuss any questions you have with your health care provider. Document Revised: 07/09/2018 Document Reviewed: 07/15/2018 Elsevier Patient Education  2020 [employer].

## 2020-01-14 NOTE — Progress Notes (Signed)
GYN ENCOUNTER NOTE  Subjective:       Brandi Pearson is a 24 y.o. G0P0000 female here for results review. Seen on 12/31/2019 with concerns regarding amenorrhea, dysmenorrhea and pelvic pain.   Reports menses started today with severe cramping.   Denies difficulty breathing or respiratory distress, chest pain, dysuria, and leg pain or swelling.    Gynecologic History  Patient's last menstrual period was 01/14/2020 (exact date). Period Cycle (Days): 28 Period Duration (Days): 7 Period Pattern: Regular Menstrual Flow: Moderate Menstrual Control: Maxi pad Dysmenorrhea: (!) Severe Dysmenorrhea Symptoms: Cramping  Contraception: none  Last Pap: 12/2019. Results were: normal  Obstetric History  OB History  Gravida Para Term Preterm AB Living  0 0 0 0 0 0  SAB TAB Ectopic Multiple Live Births  0 0 0 0 0    Past Medical History:  Diagnosis Date  . Constipation     Past Surgical History:  Procedure Laterality Date  . NO PAST SURGERIES      Current Outpatient Medications on File Prior to Visit  Medication Sig Dispense Refill  . ibuprofen (ADVIL) 600 MG tablet Take 1 tablet (600 mg total) by mouth every 6 (six) hours as needed. 20 tablet 0   No current facility-administered medications on file prior to visit.    Allergies  Allergen Reactions  . Shellfish Allergy Rash    Social History   Socioeconomic History  . Marital status: Single    Spouse name: Not on file  . Number of children: Not on file  . Years of education: Not on file  . Highest education level: Not on file  Occupational History  . Occupation: Ship broker      Tobacco Use  . Smoking status: Never Smoker  . Smokeless tobacco: Never Used  Substance and Sexual Activity  . Alcohol use: No  . Drug use: Not Currently    Types: Marijuana  . Sexual activity: Not Currently    Partners: Male    Birth control/protection: None  Other Topics Concern  . Not on file  Social History Narrative  . Not on file    Social Determinants of Health   Financial Resource Strain:   . Difficulty of Paying Living Expenses:   Food Insecurity:   . Worried About Charity fundraiser in the Last Year:   . Arboriculturist in the Last Year:   Transportation Needs:   . Film/video editor (Medical):   Marland Kitchen Lack of Transportation (Non-Medical):   Physical Activity:   . Days of Exercise per Week:   . Minutes of Exercise per Session:   Stress:   . Feeling of Stress :   Social Connections:   . Frequency of Communication with Friends and Family:   . Frequency of Social Gatherings with Friends and Family:   . Attends Religious Services:   . Active Member of Clubs or Organizations:   . Attends Archivist Meetings:   Marland Kitchen Marital Status:   Intimate Partner Violence:   . Fear of Current or Ex-Partner:   . Emotionally Abused:   Marland Kitchen Physically Abused:   . Sexually Abused:     Family History  Problem Relation Age of Onset  . Colon cancer Paternal Grandfather   . Stroke Paternal Grandfather   . Breast cancer Neg Hx   . Ovarian cancer Neg Hx     The following portions of the patient's history were reviewed and updated as appropriate: allergies, current medications, past family history, past  medical history, past social history, past surgical history and problem list.  Review of Systems  ROS negative except as noted above. Information obtained from patient.   Objective:   BP 100/68   Pulse 69   Ht 5\' 4"  (1.626 m)   Wt 201 lb 1.6 oz (91.2 kg)   LMP 01/14/2020 (Exact Date)   BMI 34.52 kg/m    CONSTITUTIONAL: Well-developed, well-nourished female in no acute distress.   Recent Results (from the past 2160 hour(s))  Cytology - PAP     Status: None   Collection Time: 12/31/19  1:34 PM  Result Value Ref Range   Adequacy      Satisfactory for evaluation; transformation zone component PRESENT.   Diagnosis      - Negative for intraepithelial lesion or malignancy (NILM)  Cervicovaginal ancillary  only     Status: None   Collection Time: 12/31/19  1:34 PM  Result Value Ref Range   Neisseria Gonorrhea Negative    Chlamydia Negative    Trichomonas Negative    Bacterial Vaginitis (gardnerella) Negative    Candida Vaginitis Negative    Candida Glabrata Negative    Comment      Normal Reference Range Bacterial Vaginosis - Negative   Comment Normal Reference Range Candida Species - Negative    Comment Normal Reference Range Candida Galbrata - Negative    Comment Normal Reference Range Trichomonas - Negative    Comment Normal Reference Ranger Chlamydia - Negative    Comment      Normal Reference Range Neisseria Gonorrhea - Negative  Prolactin     Status: None   Collection Time: 01/04/20 10:22 AM  Result Value Ref Range   Prolactin 15.4 4.8 - 23.3 ng/mL  DHEA-sulfate     Status: None   Collection Time: 01/04/20 10:22 AM  Result Value Ref Range   DHEA-SO4 206.0 110.0 - 431.7 ug/dL  Testosterone, Free, Total, SHBG     Status: Abnormal   Collection Time: 01/04/20 10:22 AM  Result Value Ref Range   Testosterone 53 (H) 8 - 48 ng/dL   Testosterone, Free 4.1 0.0 - 4.2 pg/mL   Sex Hormone Binding 46.7 24.6 - 122.0 nmol/L  Progesterone     Status: None   Collection Time: 01/04/20 10:22 AM  Result Value Ref Range   Progesterone 2.3 ng/mL    Comment:                      Follicular phase       0.1 -   0.9                      Luteal phase           1.8 -  23.9                      Ovulation phase        0.1 -  12.0                      Pregnant                         First trimester    11.0 -  44.3                         Second trimester   25.4 -  83.3  Third trimester    58.7 - 214.0                      Postmenopausal         0.0 -   0.1   Thyroid Panel With TSH     Status: Abnormal   Collection Time: 01/04/20 10:22 AM  Result Value Ref Range   TSH 2.670 0.450 - 4.500 uIU/mL   T4, Total 7.9 4.5 - 12.0 ug/dL   T3 Uptake Ratio 21 (L) 24 - 39 %   Free  Thyroxine Index 1.7 1.2 - 4.9  FSH/LH     Status: None   Collection Time: 01/04/20 10:22 AM  Result Value Ref Range   LH 16.9 mIU/mL    Comment:                     Adult Female:                       Follicular phase      2.4 -  12.6                       Ovulation phase      14.0 -  95.6                       Luteal phase          1.0 -  11.4                       Postmenopausal        7.7 -  58.5    FSH 5.1 mIU/mL    Comment:                     Adult Female:                       Follicular phase      3.5 -  12.5                       Ovulation phase       4.7 -  21.5                       Luteal phase          1.7 -   7.7                       Postmenopausal       25.8 - 134.8   Hemoglobin A1c     Status: Abnormal   Collection Time: 01/04/20 10:22 AM  Result Value Ref Range   Hgb A1c MFr Bld 4.7 (L) 4.8 - 5.6 %    Comment:          Prediabetes: 5.7 - 6.4          Diabetes: >6.4          Glycemic control for adults with diabetes: <7.0    Est. average glucose Bld gHb Est-mCnc 88 mg/dL  Lipid panel     Status: None   Collection Time: 01/04/20 10:22 AM  Result Value Ref Range   Cholesterol, Total 157 100 - 199 mg/dL   Triglycerides 65 0 - 149 mg/dL   HDL 45 >60 mg/dL   VLDL Cholesterol Cal  13 5 - 40 mg/dL   LDL Chol Calc (NIH) 99 0 - 99 mg/dL   Chol/HDL Ratio 3.5 0.0 - 4.4 ratio    Comment:                                   T. Chol/HDL Ratio                                             Men  Women                               1/2 Avg.Risk  3.4    3.3                                   Avg.Risk  5.0    4.4                                2X Avg.Risk  9.6    7.1                                3X Avg.Risk 23.4   11.0     ULTRASOUND REPORT  Location: [facility]  Date of Service: 01/04/2020    Transabdominal technique was performed for global imaging of the pelvis including uterus, ovaries, adnexal regions, and pelvic cul-de-sac.  Indications:Pelvic  Pain Findings:  The uterus is anteverted and measures 8.4 x 3.9 x 6.0 cm.. Echo texture is homogenous without evidence of focal masses.  The Endometrium measures 13 mm.  Right Ovary measures 2.2 x 2.4 x 3.2  cm. It is normal in appearance. Left Ovary measures 3.7 x 1.9 x 3.0 cm. It is normal in appearance. Survey of the adnexa demonstrates no adnexal masses. There is no free fluid in the cul de sac.  Impression: 1. Pelvic ultrasound is WNL at this time. 2. Survey of the adnexa demonstrates no adnexal masses.  Assessment:   1. Pelvic pain  2. Dysmenorrhea  Plan:   Results discussed with patient, verbalized understanding.   Declines use of medication at this time.   Handouts given with information regarding use of herbals and teas to manage dysmenorrhea.   RTC x 1 year for ANNUAL EXAM or sooner if needed.    Gunnar Bulla, CNM [facility], [facility] 01/14/20 1:45 PM

## 2020-01-27 ENCOUNTER — Encounter: Payer: Self-pay | Admitting: Certified Nurse Midwife

## 2020-04-04 ENCOUNTER — Telehealth: Payer: Self-pay | Admitting: Certified Nurse Midwife

## 2020-04-04 NOTE — Telephone Encounter (Signed)
Couldn't lmtrc for canceled appt. Called pt to reschedule.

## 2020-05-04 DIAGNOSIS — Z419 Encounter for procedure for purposes other than remedying health state, unspecified: Secondary | ICD-10-CM | POA: Diagnosis not present

## 2020-06-04 DIAGNOSIS — Z419 Encounter for procedure for purposes other than remedying health state, unspecified: Secondary | ICD-10-CM | POA: Diagnosis not present

## 2020-06-23 ENCOUNTER — Other Ambulatory Visit: Payer: Self-pay | Admitting: Physician Assistant

## 2020-06-23 DIAGNOSIS — N644 Mastodynia: Secondary | ICD-10-CM

## 2020-07-03 ENCOUNTER — Encounter: Payer: Self-pay | Admitting: Certified Nurse Midwife

## 2020-07-03 ENCOUNTER — Ambulatory Visit (INDEPENDENT_AMBULATORY_CARE_PROVIDER_SITE_OTHER): Payer: 59 | Admitting: Certified Nurse Midwife

## 2020-07-03 VITALS — BP 118/74 | HR 65 | Ht 64.0 in | Wt 203.1 lb

## 2020-07-03 DIAGNOSIS — L678 Other hair color and hair shaft abnormalities: Secondary | ICD-10-CM

## 2020-07-03 DIAGNOSIS — N644 Mastodynia: Secondary | ICD-10-CM | POA: Diagnosis not present

## 2020-07-03 DIAGNOSIS — N946 Dysmenorrhea, unspecified: Secondary | ICD-10-CM | POA: Diagnosis not present

## 2020-07-03 DIAGNOSIS — N914 Secondary oligomenorrhea: Secondary | ICD-10-CM | POA: Diagnosis not present

## 2020-07-03 NOTE — Patient Instructions (Signed)
Polycystic Ovarian Syndrome  Polycystic ovarian syndrome (PCOS) is a common hormonal disorder among women of reproductive age. In most women with PCOS, many small fluid-filled sacs (cysts) grow on the ovaries, and the cysts are not part of a normal menstrual cycle. PCOS can cause problems with your menstrual periods and make it difficult to get pregnant. It can also cause an increased risk of miscarriage with pregnancy. If it is not treated, PCOS can lead to serious health problems, such as diabetes and heart disease. What are the causes? The cause of PCOS is not known, but it may be the result of a combination of certain factors, such as:  Irregular menstrual cycle.  High levels of certain hormones (androgens).  Problems with the hormone that helps to control blood sugar (insulin resistance).  Certain genes. What increases the risk? This condition is more likely to develop in women who have a family history of PCOS. What are the signs or symptoms? Symptoms of PCOS may include:  Multiple ovarian cysts.  Infrequent periods or no periods.  Periods that are too frequent or too heavy.  Unpredictable periods.  Inability to get pregnant (infertility) because of not ovulating.  Increased growth of hair on the face, chest, stomach, back, thumbs, thighs, or toes.  Acne or oily skin. Acne may develop during adulthood, and it may not respond to treatment.  Pelvic pain.  Weight gain or obesity.  Patches of thickened and dark brown or black skin on the neck, arms, breasts, or thighs (acanthosis nigricans).  Excess hair growth on the face, chest, abdomen, or upper thighs (hirsutism). How is this diagnosed? This condition is diagnosed based on:  Your medical history.  A physical exam, including a pelvic exam. Your health care provider may look for areas of increased hair growth on your skin.  Tests, such as: ? Ultrasound. This may be used to examine the ovaries and the lining of the  uterus (endometrium) for cysts. ? Blood tests. These may be used to check levels of sugar (glucose), female hormone (testosterone), and female hormones (estrogen and progesterone) in your blood. How is this treated? There is no cure for PCOS, but treatment can help to manage symptoms and prevent more health problems from developing. Treatment varies depending on:  Your symptoms.  Whether you want to have a baby or whether you need birth control (contraception). Treatment may include nutrition and lifestyle changes along with:  Progesterone hormone to start a menstrual period.  Birth control pills to help you have regular menstrual periods.  Medicines to make you ovulate, if you want to get pregnant.  Medicine to reduce excessive hair growth.  Surgery, in severe cases. This may involve making small holes in one or both of your ovaries. This decreases the amount of testosterone that your body produces. Follow these instructions at home:  Take over-the-counter and prescription medicines only as told by your health care provider.  Follow a healthy meal plan. This can help you reduce the effects of PCOS. ? Eat a healthy diet that includes lean proteins, complex carbohydrates, fresh fruits and vegetables, low-fat dairy products, and healthy fats. Make sure to eat enough fiber.  If you are overweight, lose weight as told by your health care provider. ? Losing 10% of your body weight may improve symptoms. ? Your health care provider can determine how much weight loss is best for you and can help you lose weight safely.  Keep all follow-up visits as told by your health care provider.   This is important. Contact a health care provider if:  Your symptoms do not get better with medicine.  You develop new symptoms. This information is not intended to replace advice given to you by your health care provider. Make sure you discuss any questions you have with your health care provider. Document  Revised: 10/03/2017 Document Reviewed: 04/07/2016 Elsevier Patient Education  2020 [employer] Inc.    Diet for Polycystic Ovary Syndrome Polycystic ovary syndrome (PCOS) is a disorder of the chemicals (hormones) that regulate a woman's reproductive system, including monthly periods (menstruation). The condition causes important hormones to be out of balance. PCOS can:  Stop your periods or make them irregular.  Cause cysts to develop on your ovaries.  Make it difficult to get pregnant.  Stop your body from responding to the effects of insulin (insulin resistance). Insulin resistance can lead to obesity and diabetes. Changing what you eat can help you manage PCOS and improve your health. Following a balanced diet can help you lose weight and improve the way that your body uses insulin. What are tips for following this plan?  Follow a balanced diet for meals and snacks. Eat breakfast, lunch, dinner, and one or two snacks every day.  Include protein in each meal and snack.  Choose whole grains instead of products that are made with refined flour.  Eat a variety of foods.  Exercise regularly as told by your health care provider. Aim to do 30 or more minutes of exercise on most days of the week.  If you are overweight or obese: ? Pay attention to how many calories you eat. Cutting down on calories can help you lose weight. ? Work with your health care provider or a diet and nutrition specialist (dietitian) to figure out how many calories you need each day. What foods can I eat?  Fruits Include a variety of colors and types. All fruits are helpful for PCOS. Vegetables Include a variety of colors and types. All vegetables are helpful for PCOS. Grains Whole grains, such as whole wheat. Whole-grain breads, crackers, cereals, and pasta. Unsweetened oatmeal, bulgur, barley, quinoa, and brown rice. Tortillas made from corn or whole-wheat flour. Meats and other proteins Low-fat (lean)  proteins, such as fish, chicken, beans, eggs, and tofu. Dairy Low-fat dairy products, such as skim milk, cheese sticks, and yogurt. Beverages Low-fat or fat-free drinks, such as water, low-fat milk, sugar-free drinks, and small amounts of 100% fruit juice. Seasonings and condiments Ketchup. Mustard. Barbecue sauce. Relish. Low-fat or fat-free mayonnaise. Fats and oils Olive oil or canola oil. Walnuts and almonds. The items listed above may not be a complete list of recommended foods and beverages. Contact a dietitian for more options. What foods are not recommended? Foods that are high in calories or fat. Fried foods. Sweets. Products that are made from refined white flour, including white bread, pastries, white rice, and pasta. The items listed above may not be a complete list of foods and beverages to avoid. Contact a dietitian for more information. Summary  PCOS is a hormonal imbalance that affects a woman's reproductive system.  You can help to manage your PCOS by exercising regularly and eating a healthy, varied diet of vegetables, fruit, whole grains, low-fat (lean) protein, and low-fat dairy products.  Changing what you eat can improve the way that your body uses insulin, help your hormones reach normal levels, and help you lose weight. This information is not intended to replace advice given to you by your health care provider.  Make sure you discuss any questions you have with your health care provider. Document Revised: 02/10/2019 Document Reviewed: 08/25/2017 Elsevier Patient Education  2020 Else[employer] Inc.   Dysmenorrhea Dysmenorrhea means painful cramps during your period (menstrual period). You will have pain in your lower belly (abdomen). The pain is caused by the tightening (contracting) of the muscles of the womb (uterus). The pain may be mild or very bad. With this condition, you may:  Have a headache.  Feel sick to your stomach (nauseous).  Throw up (vomit).  Have  lower back pain. Follow these instructions at home: Helping pain and cramping   Put heat on your lower back or belly when you have pain or cramps. Use the heat source that your doctor tells you to use. ? Place a towel between your skin and the heat. ? Leave the heat on for 20-30 minutes. ? Remove the heat if your skin turns bright red. This is especially important if you cannot feel pain, heat, or cold. ? Do not have a heating pad on during sleep.  Do aerobic exercises. These include walking, swimming, or biking. These may help with cramps.  Massage your lower back or belly. This may help lessen pain. General instructions  Take over-the-counter and prescription medicines only as told by your doctor.  Do not drive or use heavy machinery while taking prescription pain medicine.  Avoid alcohol and caffeine during and right before your period. These can make cramps worse.  Do not use any products that have nicotine or tobacco. These include cigarettes and e-cigarettes. If you need help quitting, ask your doctor.  Keep all follow-up visits as told by your doctor. This is important. Contact a doctor if:  You have pain that gets worse.  You have pain that does not get better with medicine.  You have pain during sex.  You feel sick to your stomach or you throw up during your period, and medicine does not help. Get help right away if:  You pass out (faint). Summary  Dysmenorrhea means painful cramps during your period (menstrual period).  Put heat on your lower back or belly when you have pain or cramps.  Do exercises like walking, swimming, or biking to help with cramps.  Contact a doctor if you have pain during sex. This information is not intended to replace advice given to you by your health care provider. Make sure you discuss any questions you have with your health care provider. Document Revised: 10/03/2017 Document Reviewed: 11/07/2016 Elsevier Patient Education  2020  [employer].

## 2020-07-05 DIAGNOSIS — Z419 Encounter for procedure for purposes other than remedying health state, unspecified: Secondary | ICD-10-CM | POA: Diagnosis not present

## 2020-07-06 ENCOUNTER — Other Ambulatory Visit: Payer: Self-pay | Admitting: Physician Assistant

## 2020-07-06 DIAGNOSIS — N644 Mastodynia: Secondary | ICD-10-CM

## 2020-07-06 LAB — THYROID PANEL WITH TSH
Free Thyroxine Index: 1.7 (ref 1.2–4.9)
T3 Uptake Ratio: 20 % — ABNORMAL LOW (ref 24–39)
T4, Total: 8.7 ug/dL (ref 4.5–12.0)
TSH: 1.9 u[IU]/mL (ref 0.450–4.500)

## 2020-07-06 LAB — TESTOSTERONE, FREE, TOTAL, SHBG
Sex Hormone Binding: 42.5 nmol/L (ref 24.6–122.0)
Testosterone, Free: 4.1 pg/mL (ref 0.0–4.2)
Testosterone: 45 ng/dL (ref 13–71)

## 2020-07-06 LAB — CBC
Hematocrit: 39.3 % (ref 34.0–46.6)
Hemoglobin: 12.7 g/dL (ref 11.1–15.9)
MCH: 27.7 pg (ref 26.6–33.0)
MCHC: 32.3 g/dL (ref 31.5–35.7)
MCV: 86 fL (ref 79–97)
Platelets: 333 10*3/uL (ref 150–450)
RBC: 4.58 x10E6/uL (ref 3.77–5.28)
RDW: 11.7 % (ref 11.7–15.4)
WBC: 4.8 10*3/uL (ref 3.4–10.8)

## 2020-07-06 LAB — FSH/LH
FSH: 7.1 m[IU]/mL
LH: 6.5 m[IU]/mL

## 2020-07-06 LAB — PROLACTIN: Prolactin: 11 ng/mL (ref 4.8–23.3)

## 2020-07-06 NOTE — Progress Notes (Signed)
GYN ENCOUNTER NOTE  Subjective:       Brandi Pearson is a 24 y.o. G0P0000 female is here for gynecologic evaluation of the following issues:  1. Heavy menstrual bleeding 2. Breast pain 3. Intermittent lower, back pain 4. Weight gain  Taking herbs for management of dysmenorrhea and drinking Fenugreek tea nightly.   Denies difficulty breathing or respiratory distress, chest pain, dysuria, and leg pain or swelling.    Gynecologic History  Patient's last menstrual period was 06/30/2020 (approximate).   Contraception: none  Last Pap: 12/2019. Results were: normal  Obstetric History  OB History  Gravida Para Term Preterm AB Living  0 0 0 0 0 0  SAB TAB Ectopic Multiple Live Births  0 0 0 0 0    Past Medical History:  Diagnosis Date  . Constipation     Past Surgical History:  Procedure Laterality Date  . NO PAST SURGERIES      Current Outpatient Medications on File Prior to Visit  Medication Sig Dispense Refill  . ibuprofen (ADVIL) 600 MG tablet Take 1 tablet (600 mg total) by mouth every 6 (six) hours as needed. 20 tablet 0  . nystatin cream (MYCOSTATIN) Apply topically 2 (two) times daily.     No current facility-administered medications on file prior to visit.    Allergies  Allergen Reactions  . Shellfish Allergy Rash    Social History   Socioeconomic History  . Marital status: Single    Spouse name: Not on file  . Number of children: Not on file  . Years of education: Not on file  . Highest education level: Not on file  Occupational History  . Occupation: N/A      Tobacco Use  . Smoking status: Never Smoker  . Smokeless tobacco: Never Used  Vaping Use  . Vaping Use: Never used  Substance and Sexual Activity  . Alcohol use: No  . Drug use: Not Currently    Types: Marijuana  . Sexual activity: Not Currently    Partners: Male    Birth control/protection: None  Other Topics Concern  . Not on file  Social History Narrative  . Not on file    Social Determinants of Health   Financial Resource Strain:   . Difficulty of Paying Living Expenses: Not on file  Food Insecurity:   . Worried About Programme researcher, broadcasting/film/video in the Last Year: Not on file  . Ran Out of Food in the Last Year: Not on file  Transportation Needs:   . Lack of Transportation (Medical): Not on file  . Lack of Transportation (Non-Medical): Not on file  Physical Activity:   . Days of Exercise per Week: Not on file  . Minutes of Exercise per Session: Not on file  Stress:   . Feeling of Stress : Not on file  Social Connections:   . Frequency of Communication with Friends and Family: Not on file  . Frequency of Social Gatherings with Friends and Family: Not on file  . Attends Religious Services: Not on file  . Active Member of Clubs or Organizations: Not on file  . Attends Banker Meetings: Not on file  . Marital Status: Not on file  Intimate Partner Violence:   . Fear of Current or Ex-Partner: Not on file  . Emotionally Abused: Not on file  . Physically Abused: Not on file  . Sexually Abused: Not on file    Family History  Problem Relation Age of Onset  . Colon  cancer Paternal Grandfather   . Stroke Paternal Grandfather   . Breast cancer Neg Hx   . Ovarian cancer Neg Hx     The following portions of the patient's history were reviewed and updated as appropriate: allergies, current medications, past family history, past medical history, past social history, past surgical history and problem list.  Review of Systems  ROS negative except as noted above. Information obtained from patient.   Objective:   BP 118/74   Pulse 65   Ht 5\' 4"  (1.626 m)   Wt 203 lb 1.6 oz (92.1 kg)   LMP 06/30/2020 (Approximate)   BMI 34.86 kg/m    CONSTITUTIONAL: Well-developed, well-nourished female in no acute distress.   BREASTS: Breasts appear normal, no suspicious masses, no skin or nipple changes or axillary nodes.  ABDOMEN: Soft, non distended; Non  tender.  No Organomegaly.  PELVIC: Declined by patient.   MUSCULOSKELETAL: Normal range of motion. No tenderness.  No cyanosis, clubbing, or edema.   Recent Results (from the past 2160 hour(s))  FSH/LH     Status: None   Collection Time: 07/03/20 12:13 PM  Result Value Ref Range   LH 6.5 mIU/mL    Comment:                     Adult Female:                       Follicular phase      2.4 -  12.6                       Ovulation phase      14.0 -  95.6                       Luteal phase          1.0 -  11.4                       Postmenopausal        7.7 -  58.5    FSH 7.1 mIU/mL    Comment:                     Adult Female:                       Follicular phase      3.5 -  12.5                       Ovulation phase       4.7 -  21.5                       Luteal phase          1.7 -   7.7                       Postmenopausal       25.8 - 134.8   Thyroid Panel With TSH     Status: Abnormal   Collection Time: 07/03/20 12:13 PM  Result Value Ref Range   TSH 1.900 0.450 - 4.500 uIU/mL   T4, Total 8.7 4.5 - 12.0 ug/dL   T3 Uptake Ratio 20 (L) 24 - 39 %   Free Thyroxine Index 1.7 1.2 - 4.9  Prolactin  Status: None   Collection Time: 07/03/20 12:13 PM  Result Value Ref Range   Prolactin 11.0 4.8 - 23.3 ng/mL  Testosterone, Free, Total, SHBG     Status: None   Collection Time: 07/03/20 12:13 PM  Result Value Ref Range   Testosterone 45 13 - 71 ng/dL   Testosterone, Free 4.1 0.0 - 4.2 pg/mL   Sex Hormone Binding 42.5 24.6 - 122.0 nmol/L  CBC     Status: None   Collection Time: 07/03/20 12:13 PM  Result Value Ref Range   WBC 4.8 3.4 - 10.8 x10E3/uL   RBC 4.58 3.77 - 5.28 x10E6/uL   Hemoglobin 12.7 11.1 - 15.9 g/dL   Hematocrit 62.5 63.8 - 46.6 %   MCV 86 79 - 97 fL   MCH 27.7 26.6 - 33.0 pg   MCHC 32.3 31 - 35 g/dL   RDW 93.7 34.2 - 87.6 %   Platelets 333 150 - 450 x10E3/uL    Assessment:   1. Mastalgia  - FSH/LH - Thyroid Panel With TSH - Prolactin -  Testosterone, Free, Total, SHBG - CBC  2. Dysmenorrhea  - FSH/LH - Thyroid Panel With TSH - Prolactin - Testosterone, Free, Total, SHBG - CBC - US PELVIS (TRANSABDOMINAL ONLY); Future - US PELVIS TRANSVAGINAL NON-OB (TV ONLY); Future  3. Abnormal facial hair  - FSH/LH - Thyroid Panel With TSH - Prolactin - Testosterone, Free, Total, SHBG - CBC  4. Secondary oligomenorrhea  - FSH/LH - Thyroid Panel With TSH - Prolactin - Testosterone, Free, Total, SHBG - CBC - US PELVIS (TRANSABDOMINAL ONLY); Future - US PELVIS TRANSVAGINAL NON-OB (TV ONLY); Future    Plan:   Reviewed red flag symptoms and when to call.   RTC ultrasound and result review.    Serafina Royals, CNM [facility], [facility] 07/06/20 7:39 AM

## 2020-07-20 ENCOUNTER — Other Ambulatory Visit: Payer: 59

## 2020-07-20 ENCOUNTER — Encounter: Payer: 59 | Admitting: Certified Nurse Midwife

## 2020-08-04 DIAGNOSIS — Z419 Encounter for procedure for purposes other than remedying health state, unspecified: Secondary | ICD-10-CM | POA: Diagnosis not present

## 2020-08-10 ENCOUNTER — Other Ambulatory Visit: Payer: 59

## 2020-08-10 ENCOUNTER — Encounter: Payer: 59 | Admitting: Certified Nurse Midwife

## 2020-08-24 ENCOUNTER — Other Ambulatory Visit: Payer: 59

## 2020-08-24 ENCOUNTER — Encounter: Payer: 59 | Admitting: Certified Nurse Midwife

## 2020-09-04 DIAGNOSIS — Z419 Encounter for procedure for purposes other than remedying health state, unspecified: Secondary | ICD-10-CM | POA: Diagnosis not present

## 2020-09-07 ENCOUNTER — Encounter: Payer: Self-pay | Admitting: Certified Nurse Midwife

## 2020-09-07 ENCOUNTER — Other Ambulatory Visit: Payer: Self-pay

## 2020-09-07 ENCOUNTER — Ambulatory Visit (INDEPENDENT_AMBULATORY_CARE_PROVIDER_SITE_OTHER): Payer: 59 | Admitting: Certified Nurse Midwife

## 2020-09-07 ENCOUNTER — Ambulatory Visit (INDEPENDENT_AMBULATORY_CARE_PROVIDER_SITE_OTHER): Payer: 59

## 2020-09-07 VITALS — BP 116/70 | Ht 64.0 in | Wt 195.4 lb

## 2020-09-07 DIAGNOSIS — N926 Irregular menstruation, unspecified: Secondary | ICD-10-CM

## 2020-09-07 DIAGNOSIS — N83201 Unspecified ovarian cyst, right side: Secondary | ICD-10-CM

## 2020-09-07 DIAGNOSIS — N946 Dysmenorrhea, unspecified: Secondary | ICD-10-CM | POA: Diagnosis not present

## 2020-09-07 DIAGNOSIS — N914 Secondary oligomenorrhea: Secondary | ICD-10-CM | POA: Diagnosis not present

## 2020-09-07 LAB — POCT URINE PREGNANCY: Preg Test, Ur: NEGATIVE

## 2020-09-07 NOTE — Patient Instructions (Signed)
Ovarian Cyst     An ovarian cyst is a fluid-filled sac that forms on an ovary. The ovaries are small organs that produce eggs in women. Various types of cysts can form on the ovaries. Some may cause symptoms and require treatment. Most ovarian cysts go away on their own, are not cancerous (are benign), and do not cause problems. Common types of ovarian cysts include:  Functional (follicle) cysts. ? Occur during the menstrual cycle, and usually go away with the next menstrual cycle if you do not get pregnant. ? Usually cause no symptoms.  Endometriomas. ? Are cysts that form from the tissue that lines the uterus (endometrium). ? Are sometimes called "chocolate cysts" because they become filled with blood that turns brown. ? Can cause pain in the lower abdomen during intercourse and during your period.  Cystadenoma cysts. ? Develop from cells on the outside surface of the ovary. ? Can get very large and cause lower abdomen pain and pain with intercourse. ? Can cause severe pain if they twist or break open (rupture).  Dermoid cysts. ? Are sometimes found in both ovaries. ? May contain different kinds of body tissue, such as skin, teeth, hair, or cartilage. ? Usually do not cause symptoms unless they get very big.  Theca lutein cysts. ? Occur when too much of a certain hormone (human chorionic gonadotropin) is produced and overstimulates the ovaries to produce an egg. ? Are most common after having procedures used to assist with the conception of a baby (in vitro fertilization). What are the causes? Ovarian cysts may be caused by:  Ovarian hyperstimulation syndrome. This is a condition that can develop from taking fertility medicines. It causes multiple large ovarian cysts to form.  Polycystic ovarian syndrome (PCOS). This is a common hormonal disorder that can cause ovarian cysts, as well as problems with your period or fertility. What increases the risk? The following factors may  make you more likely to develop ovarian cysts:  Being overweight or obese.  Taking fertility medicines.  Taking certain forms of hormonal birth control.  Smoking. What are the signs or symptoms? Many ovarian cysts do not cause symptoms. If symptoms are present, they may include:  Pelvic pain or pressure.  Pain in the lower abdomen.  Pain during sex.  Abdominal swelling.  Abnormal menstrual periods.  Increasing pain with menstrual periods. How is this diagnosed? These cysts are commonly found during a routine pelvic exam. You may have tests to find out more about the cyst, such as:  Ultrasound.  X-ray of the pelvis.  CT scan.  MRI.  Blood tests. How is this treated? Many ovarian cysts go away on their own without treatment. Your health care provider may want to check your cyst regularly for 2-3 months to see if it changes. If you are in menopause, it is especially important to have your cyst monitored closely because menopausal women have a higher rate of ovarian cancer. When treatment is needed, it may include:  Medicines to help relieve pain.  A procedure to drain the cyst (aspiration).  Surgery to remove the whole cyst.  Hormone treatment or birth control pills. These methods are sometimes used to help dissolve a cyst. Follow these instructions at home:  Take over-the-counter and prescription medicines only as told by your health care provider.  Do not drive or use heavy machinery while taking prescription pain medicine.  Get regular pelvic exams and Pap tests as often as told by your health care provider.    Return to your normal activities as told by your health care provider. Ask your health care provider what activities are safe for you.  Do not use any products that contain nicotine or tobacco, such as cigarettes and e-cigarettes. If you need help quitting, ask your health care provider.  Keep all follow-up visits as told by your health care provider.  This is important. Contact a health care provider if:  Your periods are late, irregular, or painful, or they stop.  You have pelvic pain that does not go away.  You have pressure on your bladder or trouble emptying your bladder completely.  You have pain during sex.  You have any of the following in your abdomen: ? A feeling of fullness. ? Pressure. ? Discomfort. ? Pain that does not go away. ? Swelling.  You feel generally ill.  You become constipated.  You lose your appetite.  You develop severe acne.  You start to have more body hair and facial hair.  You are gaining weight or losing weight without changing your exercise and eating habits.  You think you may be pregnant. Get help right away if:  You have abdominal pain that is severe or gets worse.  You cannot eat or drink without vomiting.  You suddenly develop a fever.  Your menstrual period is much heavier than usual. This information is not intended to replace advice given to you by your health care provider. Make sure you discuss any questions you have with your health care provider. Document Revised: 01/19/2018 Document Reviewed: 03/24/2016 Elsevier Patient Education  2020 Elsevier Inc.  

## 2020-09-07 NOTE — Progress Notes (Signed)
GYN ENCOUNTER NOTE  Subjective:       Brandi Pearson is a 24 y.o. G0P0000 female here for results review.   Last seen in office on 07/03/2020 for evaluation of irregular menses; for further details, see previous note.   Denies difficulty breathing or respiratory distress, chest pain, abdominal pain, excessive vaginal bleeding, dysuria, and leg pain or swelling.    Gynecologic History  Patient's last menstrual period was 07/19/2020.  Contraception: coitus interruptus  Last Pap: 12/2019. Results were: normal  Obstetric History  OB History  Gravida Para Term Preterm AB Living  0 0 0 0 0 0  SAB TAB Ectopic Multiple Live Births  0 0 0 0 0    Past Medical History:  Diagnosis Date  . Constipation     Past Surgical History:  Procedure Laterality Date  . NO PAST SURGERIES      Current Outpatient Medications on File Prior to Visit  Medication Sig Dispense Refill  . cyanocobalamin 1000 MCG tablet Take by mouth.    Marland Kitchen ibuprofen (ADVIL) 600 MG tablet Take 1 tablet (600 mg total) by mouth every 6 (six) hours as needed. 20 tablet 0  . nystatin cream (MYCOSTATIN) Apply topically 2 (two) times daily. (Patient not taking: Reported on 09/07/2020)     No current facility-administered medications on file prior to visit.    Allergies  Allergen Reactions  . Shellfish Allergy Rash    Social History   Socioeconomic History  . Marital status: Single    Spouse name: Not on file  . Number of children: Not on file  . Years of education: Not on file  . Highest education level: Not on file  Occupational History  . Occupation: Consulting civil engineer  Tobacco Use  . Smoking status: Never Smoker  . Smokeless tobacco: Never Used  Vaping Use  . Vaping Use: Never used  Substance and Sexual Activity  . Alcohol use: No  . Drug use: Not Currently    Types: Marijuana  . Sexual activity: Not Currently    Partners: Male    Birth control/protection: Coitus interruptus  Other Topics Concern  . Not on  file  Social History Narrative  . Not on file   Social Determinants of Health   Financial Resource Strain:   . Difficulty of Paying Living Expenses: Not on file  Food Insecurity:   . Worried About Programme researcher, broadcasting/film/video in the Last Year: Not on file  . Ran Out of Food in the Last Year: Not on file  Transportation Needs:   . Lack of Transportation (Medical): Not on file  . Lack of Transportation (Non-Medical): Not on file  Physical Activity:   . Days of Exercise per Week: Not on file  . Minutes of Exercise per Session: Not on file  Stress:   . Feeling of Stress : Not on file  Social Connections:   . Frequency of Communication with Friends and Family: Not on file  . Frequency of Social Gatherings with Friends and Family: Not on file  . Attends Religious Services: Not on file  . Active Member of Clubs or Organizations: Not on file  . Attends Banker Meetings: Not on file  . Marital Status: Not on file  Intimate Partner Violence:   . Fear of Current or Ex-Partner: Not on file  . Emotionally Abused: Not on file  . Physically Abused: Not on file  . Sexually Abused: Not on file    Family History  Problem Relation Age of  Onset  . Colon cancer Paternal Grandfather   . Stroke Paternal Grandfather   . Breast cancer Neg Hx   . Ovarian cancer Neg Hx     The following portions of the patient's history were reviewed and updated as appropriate: allergies, current medications, past family history, past medical history, past social history, past surgical history and problem list.  Review of Systems  ROS negative except as noted above. Information obtained from patient.   Objective:   BP 116/70   Ht 5\' 4"  (1.626 m)   Wt 195 lb 6.4 oz (88.6 kg)   LMP 07/19/2020   BMI 33.54 kg/m    CONSTITUTIONAL: Well-developed, well-nourished female in no acute distress.   PHYSICAL EXAM: Not indicated.    ULTRASOUND REPORT  Location: [facility]  Date of Service: 09/07/2020    Indications:Pelvic Pain   Findings:  The uterus is anteverted and measures 7.6 x 3.3 x 4.9 cm. Echo texture is homogenous without evidence of focal masses.  The Endometrium measures 13 mm.  Right Ovary measures 2.8 x 2.8 x 3.0 cm. It is normal in appearance. Hypoechoic lesion with smooth walls and good through transmission                                                                                                                        measuring 2.1 x 1.8 x 1.5 cm. Left Ovary measures 3.3 x 2.2 x 2.6 cm. It is normal in appearance. Survey of the adnexa demonstrates no adnexal masses. There is no free fluid in the cul de sac.  Impression: 1. Rt ovarian simple cyst as mentioned above.  Recommendations: 1.Clinical correlation with the patient's History and Physical Exam.  Recent Results (from the past 2160 hour(s))  FSH/LH     Status: None   Collection Time: 07/03/20 12:13 PM  Result Value Ref Range   LH 6.5 mIU/mL    Comment:                     Adult Female:                       Follicular phase      2.4 -  12.6                       Ovulation phase      14.0 -  95.6                       Luteal phase          1.0 -  11.4                       Postmenopausal        7.7 -  58.5    FSH 7.1 mIU/mL    Comment:  Adult Female:                       Follicular phase      3.5 -  12.5                       Ovulation phase       4.7 -  21.5                       Luteal phase          1.7 -   7.7                       Postmenopausal       25.8 - 134.8   Thyroid Panel With TSH     Status: Abnormal   Collection Time: 07/03/20 12:13 PM  Result Value Ref Range   TSH 1.900 0.450 - 4.500 uIU/mL   T4, Total 8.7 4.5 - 12.0 ug/dL   T3 Uptake Ratio 20 (L) 24 - 39 %   Free Thyroxine Index 1.7 1.2 - 4.9  Prolactin     Status: None   Collection Time: 07/03/20 12:13 PM  Result Value Ref Range   Prolactin 11.0 4.8 - 23.3 ng/mL  Testosterone, Free, Total, SHBG      Status: None   Collection Time: 07/03/20 12:13 PM  Result Value Ref Range   Testosterone 45 13 - 71 ng/dL   Testosterone, Free 4.1 0.0 - 4.2 pg/mL   Sex Hormone Binding 42.5 24.6 - 122.0 nmol/L  CBC     Status: None   Collection Time: 07/03/20 12:13 PM  Result Value Ref Range   WBC 4.8 3.4 - 10.8 x10E3/uL   RBC 4.58 3.77 - 5.28 x10E6/uL   Hemoglobin 12.7 11.1 - 15.9 g/dL   Hematocrit 98.9 21.1 - 46.6 %   MCV 86 79 - 97 fL   MCH 27.7 26.6 - 33.0 pg   MCHC 32.3 31 - 35 g/dL   RDW 94.1 74.0 - 81.4 %   Platelets 333 150 - 450 x10E3/uL  POCT urine pregnancy     Status: None   Collection Time: 09/07/20  4:10 PM  Result Value Ref Range   Preg Test, Ur Negative Negative    Assessment:   1. Irregular menses  - POCT urine pregnancy   2. Right ovarian cyst   Plan:   Ultrasound results discussed with patient in detail, verbalized understanding.   Education regarding ovarian cysts, see AVS.   Reviewed red flag symptoms and when to call.   RTC x 6-8 weeks for repeat ultrasound and results review or sooner if needed.    Serafina Royals, CNM [facility], [facility] 09/07/20 5:37 PM

## 2020-09-07 NOTE — Progress Notes (Signed)
Patient states she was sexually active in September. Contraceptive method used- coitus interruptus.  LMP: 07/19/20. pt requests UPT.

## 2020-10-04 DIAGNOSIS — Z419 Encounter for procedure for purposes other than remedying health state, unspecified: Secondary | ICD-10-CM | POA: Diagnosis not present

## 2020-10-19 ENCOUNTER — Encounter: Payer: 59 | Admitting: Certified Nurse Midwife

## 2020-10-19 ENCOUNTER — Other Ambulatory Visit: Payer: 59

## 2020-11-04 DIAGNOSIS — Z419 Encounter for procedure for purposes other than remedying health state, unspecified: Secondary | ICD-10-CM | POA: Diagnosis not present

## 2020-11-09 ENCOUNTER — Other Ambulatory Visit: Payer: 59

## 2020-11-09 ENCOUNTER — Encounter: Payer: 59 | Admitting: Certified Nurse Midwife

## 2020-11-16 ENCOUNTER — Other Ambulatory Visit: Payer: 59

## 2020-11-16 ENCOUNTER — Encounter: Payer: 59 | Admitting: Certified Nurse Midwife

## 2020-11-22 ENCOUNTER — Emergency Department: Payer: 59

## 2020-11-22 ENCOUNTER — Emergency Department
Admission: EM | Admit: 2020-11-22 | Discharge: 2020-11-23 | Disposition: A | Discharge status: Home / Self Care | Payer: 59 | Attending: Emergency Medicine | Admitting: Emergency Medicine

## 2020-11-22 ENCOUNTER — Other Ambulatory Visit: Payer: Self-pay

## 2020-11-22 DIAGNOSIS — N730 Acute parametritis and pelvic cellulitis: Secondary | ICD-10-CM

## 2020-11-22 DIAGNOSIS — A539 Syphilis, unspecified: Secondary | ICD-10-CM | POA: Diagnosis not present

## 2020-11-22 DIAGNOSIS — K5900 Constipation, unspecified: Secondary | ICD-10-CM | POA: Diagnosis not present

## 2020-11-22 DIAGNOSIS — R102 Pelvic and perineal pain unspecified side: Secondary | ICD-10-CM

## 2020-11-22 DIAGNOSIS — N739 Female pelvic inflammatory disease, unspecified: Secondary | ICD-10-CM | POA: Diagnosis not present

## 2020-11-22 DIAGNOSIS — E860 Dehydration: Secondary | ICD-10-CM | POA: Diagnosis not present

## 2020-11-22 LAB — COMPREHENSIVE METABOLIC PANEL
ALT: 8 U/L (ref 0–44)
AST: 22 U/L (ref 15–41)
Albumin: 3.8 g/dL (ref 3.5–5.0)
Alkaline Phosphatase: 71 U/L (ref 38–126)
Anion gap: 11 (ref 5–15)
BUN: 5 mg/dL — ABNORMAL LOW (ref 6–20)
CO2: 21 mmol/L — ABNORMAL LOW (ref 22–32)
Calcium: 9.2 mg/dL (ref 8.9–10.3)
Chloride: 104 mmol/L (ref 98–111)
Creatinine, Ser: 0.75 mg/dL (ref 0.44–1.00)
GFR, Estimated: >60 mL/min (ref >60)
Glucose, Bld: 94 mg/dL (ref 70–99)
Potassium: 3.6 mmol/L (ref 3.5–5.1)
Sodium: 136 mmol/L (ref 135–145)
Total Bilirubin: 0.8 mg/dL (ref 0.3–1.2)
Total Protein: 8.1 g/dL (ref 6.5–8.1)

## 2020-11-22 LAB — URINALYSIS, COMPLETE (UACMP) WITH MICROSCOPIC
Bacteria, UA: NONE SEEN
Bilirubin Urine: NEGATIVE
Glucose, UA: NEGATIVE mg/dL
Ketones, ur: 20 mg/dL — AB
Nitrite: NEGATIVE
Protein, ur: 100 mg/dL — AB
RBC / HPF: >50 RBC/hpf — ABNORMAL HIGH (ref 0–5)
Specific Gravity, Urine: 1.02 (ref 1.005–1.030)
WBC, UA: >50 WBC/hpf — ABNORMAL HIGH (ref 0–5)
pH: 5 (ref 5.0–8.0)

## 2020-11-22 LAB — LIPASE, BLOOD: Lipase: 22 U/L (ref 11–51)

## 2020-11-22 LAB — CBC
HCT: 39.5 % (ref 36.0–46.0)
Hemoglobin: 12.9 g/dL (ref 12.0–15.0)
MCH: 27.4 pg (ref 26.0–34.0)
MCHC: 32.7 g/dL (ref 30.0–36.0)
MCV: 84 fL (ref 80.0–100.0)
Platelets: 296 10*3/uL (ref 150–400)
RBC: 4.7 MIL/uL (ref 3.87–5.11)
RDW: 12.2 % (ref 11.5–15.5)
WBC: 10.3 10*3/uL (ref 4.0–10.5)
nRBC: 0 % (ref 0.0–0.2)

## 2020-11-22 LAB — POC URINE PREG, ED: Preg Test, Ur: NEGATIVE

## 2020-11-22 MED ORDER — OXYCODONE-ACETAMINOPHEN 5-325 MG PO TABS
1.0000 | ORAL_TABLET | Freq: Once | ORAL | Status: AC
  Administered 2020-11-23: 1 via ORAL
  Filled 2020-11-22: qty 1

## 2020-11-22 MED ORDER — SODIUM CHLORIDE 0.9 % IV BOLUS
1000.0000 mL | Freq: Once | INTRAVENOUS | Status: AC
  Administered 2020-11-22: 1000 mL via INTRAVENOUS

## 2020-11-22 MED ORDER — FENTANYL CITRATE (PF) 100 MCG/2ML IJ SOLN
50.0000 ug | Freq: Once | INTRAMUSCULAR | Status: AC
  Administered 2020-11-22: 50 ug via INTRAMUSCULAR

## 2020-11-22 MED ORDER — FENTANYL CITRATE (PF) 100 MCG/2ML IJ SOLN
50.0000 ug | Freq: Once | INTRAMUSCULAR | Status: DC
  Filled 2020-11-22: qty 2

## 2020-11-22 MED ORDER — HYDROCODONE-ACETAMINOPHEN 5-325 MG PO TABS: 1.0000 | ORAL_TABLET | Freq: Four times a day (QID) | ORAL | 0 refills | Status: DC | PRN

## 2020-11-22 MED ORDER — PENICILLIN G BENZATHINE 1200000 UNIT/2ML IM SUSP
2.4000 10*6.[IU] | Freq: Once | INTRAMUSCULAR | Status: AC
  Administered 2020-11-22: 2.4 10*6.[IU] via INTRAMUSCULAR
  Filled 2020-11-22: qty 4

## 2020-11-22 MED ORDER — ONDANSETRON HCL 4 MG/2ML IJ SOLN
4.0000 mg | Freq: Once | INTRAMUSCULAR | Status: DC
  Filled 2020-11-22: qty 2

## 2020-11-22 MED ORDER — SODIUM CHLORIDE 0.9 % IV SOLN
2.0000 g | Freq: Once | INTRAVENOUS | Status: AC
  Administered 2020-11-22: 2 g via INTRAVENOUS
  Filled 2020-11-22: qty 20

## 2020-11-22 MED ORDER — KETOROLAC TROMETHAMINE 30 MG/ML IJ SOLN
15.0000 mg | Freq: Once | INTRAMUSCULAR | Status: AC
  Administered 2020-11-22: 15 mg via INTRAVENOUS
  Filled 2020-11-22: qty 1

## 2020-11-22 MED ORDER — ONDANSETRON 4 MG PO TBDP: 4.0000 mg | ORAL_TABLET | Freq: Three times a day (TID) | ORAL | 0 refills | Status: DC | PRN

## 2020-11-22 MED ORDER — IBUPROFEN 600 MG PO TABS: 600.0000 mg | ORAL_TABLET | Freq: Three times a day (TID) | ORAL | 0 refills | Status: AC

## 2020-11-22 MED ORDER — HYDROMORPHONE HCL 1 MG/ML IJ SOLN
1.0000 mg | Freq: Once | INTRAMUSCULAR | Status: AC
  Administered 2020-11-22: 1 mg via INTRAVENOUS
  Filled 2020-11-22: qty 1

## 2020-11-22 MED ORDER — ONDANSETRON 4 MG PO TBDP
ORAL_TABLET | ORAL | Status: AC
  Filled 2020-11-22: qty 1

## 2020-11-22 MED ORDER — METRONIDAZOLE 500 MG PO TABS: 500.0000 mg | ORAL_TABLET | Freq: Three times a day (TID) | ORAL | 0 refills | Status: AC

## 2020-11-22 MED ORDER — ONDANSETRON 4 MG PO TBDP
4.0000 mg | ORAL_TABLET | Freq: Once | ORAL | Status: AC
  Administered 2020-11-22: 4 mg via ORAL

## 2020-11-22 NOTE — ED Notes (Signed)
Patient to Ultrasound with technician.

## 2020-11-22 NOTE — ED Notes (Signed)
Patient returned from Ultrasound. 

## 2020-11-22 NOTE — ED Triage Notes (Signed)
C/O right lower abdominal pain x 1 week.  Pain worsening.  Seen by PCP yesterday for same and started on Doxycycline.  Patient is AAOx3.  Grunting in pain at this time.  Skin warm and dry.

## 2020-11-22 NOTE — ED Provider Notes (Signed)
[facility] Emergency Department Provider Note  ____________________________________________   Event Date/Time   First MD Initiated Contact with Patient 11/22/20 1758     (approximate)  I have reviewed the triage vital signs and the nursing notes.   HISTORY  Chief Complaint Abdominal Pain    HPI Brandi Pearson is a 25 y.o. female  With PMHx constipation here with pelvic pain. Pt has a reported h/o dysmenorrhea, pelvic pain. She was recently seen and tx for UTI. RPR came back positive today. Over the last several days, she's had progressively worsening abdominal pain, which is 10/10, aching, throbbing, severe. Occasionally radiates to her back. She is having intermittent sharp, stabbing pain as well. Pain is worse w/ movement, palpation. No alleviating factors. No flank pain. Denies known h/o STIs but did have sexual intercourse, unprotected, on 1/11 prior to the onset of sx.        Past Medical History:  Diagnosis Date  . Constipation     Patient Active Problem List   Diagnosis Date Noted  . Dysmenorrhea 01/14/2020  . Pelvic pain 01/14/2020  . MDD (major depressive disorder), recurrent episode, severe (HCC) 01/11/2013  . PTSD (post-traumatic stress disorder) 01/11/2013  . 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 NO PAST SURGERIES      Prior to Admission medications   Medication Sig Start Date End Date Taking? Authorizing Provider  HYDROcodone-acetaminophen (NORCO/VICODIN) 5-325 MG tablet Take 1-2 tablets by mouth every 6 (six) hours as needed for moderate pain or severe pain (no more than 6 tabs per day). 11/22/20 11/22/21 Yes Shaune Pollack, MD  ibuprofen (ADVIL) 600 MG tablet Take 1 tablet (600 mg total) by mouth 3 (three) times daily for 5 days. 11/22/20 11/27/20 Yes Shaune Pollack, MD  metroNIDAZOLE (FLAGYL) 500 MG tablet Take 1 tablet (500 mg total) by mouth 3 (three) times daily for 10  days. 11/22/20 12/02/20 Yes Shaune Pollack, MD  ondansetron (ZOFRAN ODT) 4 MG disintegrating tablet Take 1 tablet (4 mg total) by mouth every 8 (eight) hours as needed for nausea or vomiting. 11/22/20  Yes Shaune Pollack, MD  cyanocobalamin 1000 MCG tablet Take by mouth.    [provider]  nystatin cream (MYCOSTATIN) Apply topically 2 (two) times daily. Patient not taking: Reported on 09/07/2020 06/21/20   [provider]    Allergies Shellfish allergy  Family History  Problem Relation Age of Onset  . Colon cancer Paternal Grandfather   . Stroke Paternal Grandfather   . Breast cancer Neg Hx   . Ovarian cancer Neg Hx     Social History Social History   Tobacco Use  . Smoking status: Never Smoker  . Smokeless tobacco: Never Used  Vaping Use  . Vaping Use: Never used  Substance Use Topics  . Alcohol use: No  . Drug use: Not Currently    Types: Marijuana    Review of Systems  Review of Systems  Constitutional: Positive for chills and fatigue. Negative for fever.  HENT: Negative for congestion and sore throat.   Eyes: Negative for visual disturbance.  Respiratory: Negative for cough and shortness of breath.   Cardiovascular: Negative for chest pain.  Gastrointestinal: Positive for nausea. Negative for abdominal pain, diarrhea and vomiting.  Genitourinary: Positive for pelvic pain and vaginal pain. Negative for flank pain.  Musculoskeletal: Negative for back pain and neck pain.  Skin: Negative for rash and wound.  Neurological: Negative for weakness.  Psychiatric/Behavioral:  Negative for agitation.  All other systems reviewed and are negative.    ____________________________________________  PHYSICAL EXAM:      VITAL SIGNS: ED Triage Vitals [11/22/20 1709]  Enc Vitals Group     BP 119/80     Pulse Rate (!) 126     Resp 18     Temp 98.7 F (37.1 C)     Temp src      SpO2 100 %     Weight      Height      Head Circumference      Peak Flow       Pain Score 10     Pain Loc      Pain Edu?      Excl. in GC?      Physical Exam Vitals and nursing note reviewed.  Constitutional:      General: She is not in acute distress.    Appearance: She is well-developed.     Comments: Appears uncomfortable  HENT:     Head: Normocephalic and atraumatic.  Eyes:     Conjunctiva/sclera: Conjunctivae normal.  Cardiovascular:     Rate and Rhythm: Normal rate and regular rhythm.     Heart sounds: Normal heart sounds. No murmur heard. No friction rub.  Pulmonary:     Effort: Pulmonary effort is normal. No respiratory distress.     Breath sounds: Normal breath sounds. No wheezing or rales.  Abdominal:     General: There is no distension.     Palpations: Abdomen is soft.     Tenderness: There is abdominal tenderness in the suprapubic area.  Musculoskeletal:     Cervical back: Neck supple.  Skin:    General: Skin is warm.     Capillary Refill: Capillary refill takes less than 2 seconds.  Neurological:     Mental Status: She is alert and oriented to person, place, and time.     Motor: No abnormal muscle tone.       ____________________________________________   LABS (all labs ordered are listed, but only abnormal results are displayed)  Labs Reviewed  COMPREHENSIVE METABOLIC PANEL - Abnormal; Notable for the following components:      Result Value   CO2 21 (*)    BUN 5 (*)    All other components within normal limits  URINALYSIS, COMPLETE (UACMP) WITH MICROSCOPIC - Abnormal; Notable for the following components:   Color, Urine AMBER (*)    APPearance CLOUDY (*)    Hgb urine dipstick LARGE (*)    Ketones, ur 20 (*)    Protein, ur 100 (*)    Leukocytes,Ua MODERATE (*)    RBC / HPF >50 (*)    WBC, UA >50 (*)    All other components within normal limits  LIPASE, BLOOD  CBC  FLUORESCENT TREPONEMAL AB(FTA)-IGG-BLD  POC URINE PREG, ED    ____________________________________________  EKG:   ________________________________________  RADIOLOGY All imaging, including plain films, CT scans, and ultrasounds, independently reviewed by me, and interpretations confirmed via formal radiology reads.  ED MD interpretation:   US: Normal examination  Official radiology report(s): US PELVIC COMPLETE W TRANSVAGINAL AND TORSION R/O  Result Date: 11/22/2020 CLINICAL DATA:  Bilateral pelvic pain EXAM: TRANSABDOMINAL AND TRANSVAGINAL ULTRASOUND OF PELVIS DOPPLER ULTRASOUND OF OVARIES TECHNIQUE: Both transabdominal and transvaginal ultrasound examinations of the pelvis were performed. Transabdominal technique was performed for global imaging of the pelvis including uterus, ovaries, adnexal regions, and pelvic cul-de-sac. It was necessary to proceed with  endovaginal exam following the transabdominal exam to visualize the . Color and duplex Doppler ultrasound was utilized to evaluate blood flow to the ovaries. COMPARISON:  None. FINDINGS: Uterus Measurements: 8.2 x 3.4 x 4.8 cm = volume: 70 mL. No fibroids or other mass visualized. Endometrium Thickness: 7 mm .  No focal abnormality visualized. Right ovary Measurements: 1.7 x 1.3 x 2.5 cm = volume: 2.9 mL. Normal appearance/no adnexal mass. Left ovary Measurements: 3.4 x 1.1 x 1.2 cm = volume: 2.3 mL. Normal appearance/no adnexal mass. Pulsed Doppler evaluation of both ovaries demonstrates normal low-resistance arterial and venous waveforms. Other findings No abnormal free fluid. IMPRESSION: Normal examination Electronically Signed   By: Jonna Clark M.D.   On: 11/22/2020 21:52    ____________________________________________  PROCEDURES   Procedure(s) performed (including Critical Care):  Procedures  ____________________________________________  INITIAL IMPRESSION / MDM / ASSESSMENT AND PLAN / ED COURSE  As part of my medical decision making, I reviewed the following data within the electronic MEDICAL RECORD NUMBER Nursing notes reviewed and  incorporated, Old chart reviewed, Notes from prior ED visits, and [place] Controlled Substance Database       *Elsye Pearson was evaluated in Emergency Department on 11/22/2020 for the symptoms described in the history of present illness. She was evaluated in the context of the global COVID-19 pandemic, which necessitated consideration that the patient might be at risk for infection with the SARS-CoV-2 virus that causes COVID-19. Institutional protocols and algorithms that pertain to the evaluation of patients at risk for COVID-19 are in a state of rapid change based on information released by regulatory bodies including the CDC and federal and state organizations. These policies and algorithms were followed during the patient's care in the ED.  Some ED evaluations and interventions may be delayed as a result of limited staffing during the pandemic.*     Medical Decision Making:  25 yo F here with lower pelvic pain. Recently diagnosed with PID and RPR+. Pt given IM penicillin for syphilis and IVF, AX for her PID. US obtained and shows no evidence of TOA, torsion. CBC, CMP unremarkable. No signs of sepsis. Pt afebrile and non-toxic. Tolerating PO. Will continue empiric tx but add flagyl, antiemetics, and analgesics. Return precautions given.  ____________________________________________  FINAL CLINICAL IMPRESSION(S) / ED DIAGNOSES  Final diagnoses:  Pelvic pain  PID (acute pelvic inflammatory disease)  Dehydration  Syphilis     MEDICATIONS GIVEN DURING THIS VISIT:  Medications  oxyCODONE-acetaminophen (PERCOCET/ROXICET) 5-325 MG per tablet 1 tablet (has no administration in time range)  fentaNYL (SUBLIMAZE) injection 50 mcg (50 mcg Intramuscular Given 11/22/20 1738)  ondansetron (ZOFRAN-ODT) disintegrating tablet 4 mg (4 mg Oral Given 11/22/20 1739)  sodium chloride 0.9 % bolus 1,000 mL (0 mLs Intravenous Stopped 11/22/20 2158)  cefTRIAXone (ROCEPHIN) 2 g in sodium chloride 0.9 % 100 mL IVPB (0  g Intravenous Stopped 11/22/20 2032)  ketorolac (TORADOL) 30 MG/ML injection 15 mg (15 mg Intravenous Given 11/22/20 1941)  HYDROmorphone (DILAUDID) injection 1 mg (1 mg Intravenous Given 11/22/20 1942)  penicillin g benzathine (BICILLIN LA) 1200000 UNIT/2ML injection 2.4 Million Units (2.4 Million Units Intramuscular Given 11/22/20 1943)  sodium chloride 0.9 % bolus 1,000 mL (1,000 mLs Intravenous New Bag/Given 11/22/20 2155)     ED Discharge Orders         Ordered    metroNIDAZOLE (FLAGYL) 500 MG tablet  3 times daily        11/22/20 2202    HYDROcodone-acetaminophen (NORCO/VICODIN) 5-325 MG tablet  Every 6 hours PRN        11/22/20 2202    ondansetron (ZOFRAN ODT) 4 MG disintegrating tablet  Every 8 hours PRN        11/22/20 2202    ibuprofen (ADVIL) 600 MG tablet  3 times daily        11/22/20 2202           Note:  This document was prepared using Dragon voice recognition software and may include unintentional dictation errors.   Shaune Pollack, MD 11/22/20 2243

## 2020-11-22 NOTE — ED Triage Notes (Addendum)
Pt comes with c/o RLQ pain for about a week. Pt moaning and groaning. Pt states N/V. Pt states she was given a shot in her ass. Pt states she doesn't know why they gave it to her because the MD didn't know what was wrong. Pt states her STD test was negative and RPR came back reactive and was given meds. Pt states pain got worse this am.  Pt states burning and little itching. Pt states some vaginal bleeding. Pt states neg preg test at PCP yesterday.  Pt states rectum pain too. Pt states she feels like her uterus is falling out.

## 2020-11-23 DIAGNOSIS — N739 Female pelvic inflammatory disease, unspecified: Secondary | ICD-10-CM | POA: Diagnosis not present

## 2020-11-23 MED ORDER — ONDANSETRON HCL 4 MG/2ML IJ SOLN
4.0000 mg | Freq: Once | INTRAMUSCULAR | Status: AC
  Administered 2020-11-23: 4 mg via INTRAVENOUS
  Filled 2020-11-23: qty 2

## 2020-11-23 NOTE — ED Notes (Addendum)
Discharged delayed due to patient requesting anti-emetic prior to leaving -- per charge ok to contact Dr Derrill Kay via secure chat -- Dr Derrill Kay subsequently contacted and ordered IVP Zofran - med administered without issue.  Pt agreeable to d/c plan as reviewed earlier by provider- this nurse has verbally reinforced d/c instructions and provided pt with written copy - pt denies any additional questions concerns needs - pt escorted to exit via w/c -- father waiting to drive patient home

## 2020-11-27 LAB — FLUORESCENT TREPONEMAL AB(FTA)-IGG-BLD

## 2020-11-30 ENCOUNTER — Encounter: Payer: 59 | Admitting: Certified Nurse Midwife

## 2020-11-30 ENCOUNTER — Other Ambulatory Visit: Payer: Self-pay

## 2020-11-30 ENCOUNTER — Ambulatory Visit (INDEPENDENT_AMBULATORY_CARE_PROVIDER_SITE_OTHER): Payer: 59 | Admitting: Certified Nurse Midwife

## 2020-11-30 ENCOUNTER — Encounter: Payer: Self-pay | Admitting: Certified Nurse Midwife

## 2020-11-30 ENCOUNTER — Other Ambulatory Visit: Payer: 59

## 2020-11-30 VITALS — BP 106/75 | HR 65 | Ht 64.0 in | Wt 184.6 lb

## 2020-11-30 DIAGNOSIS — N739 Female pelvic inflammatory disease, unspecified: Secondary | ICD-10-CM | POA: Diagnosis not present

## 2020-11-30 DIAGNOSIS — A530 Latent syphilis, unspecified as early or late: Secondary | ICD-10-CM

## 2020-11-30 DIAGNOSIS — R112 Nausea with vomiting, unspecified: Secondary | ICD-10-CM

## 2020-11-30 NOTE — Patient Instructions (Signed)
Safe Sex Practicing safe sex means taking steps before and during sex to reduce your risk of:  Getting an STI (sexually transmitted infection).  Giving your partner an STI.  Unwanted or unplanned pregnancy. How to practice safe sex Ways you can practice safe sex  Limit your sexual partners to only one partner who is having sex with only you.  Avoid using alcohol and drugs before having sex. Alcohol and drugs can affect your judgment.  Before having sex with a new partner: ? Talk to your partner about past partners, past STIs, and drug use. ? Get screened for STIs and discuss the results with your partner. Ask your partner to get screened too.  Check your body regularly for sores, blisters, rashes, or unusual discharge. If you notice any of these problems, visit your health care provider.  Avoid sexual contact if you have symptoms of an infection or you are being treated for an STI.  While having sex, use a condom. Make sure to: ? Use a condom every time you have vaginal, oral, or anal sex. Both females and males should wear condoms during oral sex. ? Keep condoms in place from the beginning to the end of sexual activity. ? Use a latex condom, if possible. Latex condoms offer the best protection. ? Use only water-based lubricants with a condom. Using petroleum-based lubricants or oils will weaken the condom and increase the chance that it will break.   Ways your health care provider can help you practice safe sex  See your health care provider for regular screenings, exams, and tests for STIs.  Talk with your health care provider about what kind of birth control (contraception) is best for you.  Get vaccinated against hepatitis B and human papillomavirus (HPV).  If you are at risk of being infected with HIV (human immunodeficiency virus), talk with your health care provider about taking a prescription medicine to prevent HIV infection. You are at risk for HIV if you: ? Are a man  who has sex with other men. ? Are sexually active with more than one partner. ? Take drugs by injection. ? Have a sex partner who has HIV. ? Have unprotected sex. ? Have sex with someone who has sex with both men and women. ? Have had an STI.   Follow these instructions at home:  Take over-the-counter and prescription medicines only as told by your health care provider.  Keep all follow-up visits. This is important. Where to find more information  Centers for Disease Control and Prevention: FootballExhibition.com.br  Planned Parenthood: www.plannedparenthood.org  Office on [employer] [facility]: http://hoffman.com/ Summary  Practicing safe sex means taking steps before and during sex to reduce your risk getting an STI, giving your partner an STI, and having an unwanted or unplanned pregnancy.  Before having sex with a new partner, talk to your partner about past partners, past STIs, and drug use.  Use a condom every time you have vaginal, oral, or anal sex. Both females and males should wear condoms during oral sex.  Check your body regularly for sores, blisters, rashes, or unusual discharge. If you notice any of these problems, visit your health care provider.  See your health care provider for regular screenings, exams, and tests for STIs. This information is not intended to replace advice given to you by your health care provider. Make sure you discuss any questions you have with your health care provider. Document Revised: 03/27/2020 Document Reviewed: 03/27/2020 Elsevier Patient Education  2021 [employer].  Pelvic Inflammatory Disease  Pelvic inflammatory disease (PID) is caused by an infection in some or all of the female reproductive organs. The infection can be in the uterus, ovaries, fallopian tubes, or the surrounding tissues in the pelvis. PID can cause abdominal or pelvic pain that comes on suddenly (acute pelvic pain). PID is a serious infection because it can lead to lasting  (chronic) pelvic pain or the inability to have children (infertility). What are the causes? This condition is most often caused by bacteria that is spread during sexual contact. It can also be caused by a bacterial infection of the vagina (bacterial vaginosis) that is not spread by sexual contact. This condition occurs when the infection is not treated and the bacteria travel upward from the vagina or cervix into the reproductive organs. Bacteria may also be introduced into the reproductive organs following:  The birth of a baby.  A miscarriage.  An abortion.  Major pelvic surgery.  The insertion of an intrauterine device (IUD).  A sexual assault. What increases the risk? You are more likely to develop this condition if you:  Are younger than 23 years of age.  Are sexually active at a young age.  Have a history of STI (sexually transmitted infection) or PID.  Do not regularly use barrier contraception methods, such as condoms.  Have multiple sexual partners.  Have sex with someone who has symptoms of an STI.  Use a vaginal douche.  Have recently had an IUD inserted. What are the signs or symptoms? Symptoms of this condition include:  Abdominal or pelvic pain.  Fever.  Chills.  Abnormal vaginal discharge.  Abnormal uterine bleeding.  Unusual pain shortly after the end of a menstrual period.  Painful urination.  Pain with sex.  Nausea and vomiting. How is this diagnosed? This condition is diagnosed based on a pelvic exam and medical history. A pelvic exam can reveal signs of infection, inflammation, and discharge in the vagina and the surrounding tissues. It can also help to identify painful areas. You may also have tests, such as:  Lab tests, including a pregnancy test, blood tests, and a urine test.  Culture tests of the vagina and cervix to check for an STI.  Ultrasound.  A laparoscopic procedure to look inside the pelvis.  Examination of vaginal  discharge under a microscope. How is this treated? This condition may be treated with:  Antibiotic medicines taken by mouth (orally). For more severe cases, antibiotics may be given through an IV at the hospital.  Surgery. This is rare. Surgery may be needed if other treatments do not help.  Efforts to stop the spread of the infection. Sexual partners may need to be treated if the infection is caused by an STI. It may take weeks until you are completely well. If you are diagnosed with PID, you should also be checked for HIV (human immunodeficiency virus). Your health care provider may test you for infection again 3 months after treatment. You should not have unprotected sex. Follow these instructions at home:  Take over-the-counter and prescription medicines only as told by your health care provider.  If you were prescribed an antibiotic medicine, take it as told by your health care provider. Do not stop using the antibiotic even if you start to feel better.  Do not have sex until treatment is completed or as told by your health care provider. If PID is confirmed, your recent sexual partners will need treatment, especially if you had unprotected sex.  Keep  all follow-up visits as told by your health care provider. This is important. Contact a health care provider if:  You have increased or abnormal vaginal discharge.  Your pain does not improve.  You vomit.  You have a fever.  You cannot tolerate your medicines.  Your partner has an STI.  You have pain when you urinate. Get help right away if:  You have increased abdominal or pelvic pain.  You have chills.  Your symptoms are not better in 72 hours with treatment. Summary  Pelvic inflammatory disease (PID) is caused by an infection in some or all of the female reproductive organs.  PID is a serious infection because it can lead to lasting (chronic) pelvic pain or the inability to have children (infertility).  This  infection is usually treated with antibiotic medicines.  Do not have sex until treatment is completed or as told by your health care provider. This information is not intended to replace advice given to you by your health care provider. Make sure you discuss any questions you have with your health care provider. Document Revised: 07/09/2018 Document Reviewed: 07/14/2018 Elsevier Patient Education  2021 Elsevier Inc.   Syphilis Syphilis is an infection that can spread through sexual contact. The infection can cause serious complications, so it is important to get treatment right away. There are four stages of syphilis:  Primary stage. During this stage sores may form where the disease entered your body.  Secondary stage. During this stage skin rashes and lesions will form.  Latent stage. During this stage there are no symptoms, but the infection may still be contagious.  Tertiary stage. This stage happens 10-30 years after the infection starts. During this stage, the disease damages organs and can lead to death. Most people do not develop this stage of syphilis. What are the causes? This condition is caused by bacteria called Treponema pallidum. The condition can spread during sexual activity, such as during oral, anal, or vaginal sex. It can also be spread from mother to fetus during pregnancy. What increases the risk? You are more likely to develop this condition if:  You do not use a condom.  You have or had another sexually transmitted infection (STI).  You have multiple sex partners.  You use illegal drugs through an IV. What are the signs or symptoms? Symptoms of this condition depend on the stage of the disease. Primary stage  Painless sores (chancres) in and around the genital organs, mouth, or hands. The sores are usually firm and round. Secondary stage  A rash or sores. The rash usually does not itch.  A fever.  A headache.  A sore throat.  Swollen lymph  nodes.  New sores in the mouth or on the genitals.  A feeling of being ill.  Pain in the joints.  Patchy hair loss.  Weight loss.  Fatigue. Latent stage There are no symptoms during this stage. Tertiary stage  Dementia.  Personality and mood changes.  Difficulty walking and coordinating movements.  Muscle weakness or paralysis.  Problems with coordination.  Heart failure.  Trouble breathing.  Fainting.  Soft, rubbery growths on the skin, bones, or liver (gummas).  Sudden "lightning" pains, numbness, or tingling.  Vision changes.  Hearing changes.  Trouble controlling your urine and bowel movements. How is this diagnosed? This condition is diagnosed with:  A physical exam.  Blood tests.  Tests of the fluid (drainage) from a sore or rash.  Tests of the fluid around the spine (lumbar puncture).  These tests are done to check for an infection in the brain or nervous system.  Imaging tests. These may be done to check for damage to the heart, aorta, or brain if the condition is in the tertiary stage. Tests may include: ? An X-ray. ? A CT scan. ? An MRI. ? An echocardiogram. This test takes a picture of the heart. ? An ultrasound.   How is this treated? This condition can be cured with antibiotic medicine. During the first day of treatment, the medicine may cause you to experience fever, chills, headache, nausea, or aching all over your body. This is normal and should go away within 24 hours. Follow these instructions at home: Medicines  Take over-the-counter and prescription medicines only as told by your health care provider.  Take your antibiotic medicine as told by your health care provider. Do not stop taking the antibiotic even if you start to feel better. Incomplete treatment will put you at risk for continued infection and could be life threatening.   General instructions  Do not have sex until your treatment is completed, or as directed by your  health care provider.  Tell your recent sexual partners that you were diagnosed with syphilis. It is important that they get treatment, even if they do not have symptoms.  Keep all follow-up visits as told by your health care provider. This is important. How is this prevented?  Use latex condoms correctly whenever you have sex.  Before you have sex, ask your partner if he or she has been tested for STIs. Ask about the test results.  Avoid having multiple sexual partners. Contact a health care provider if:  You continue to have any of the following symptoms 24 hours after beginning treatment: ? Fever. ? Chills. ? Headache. ? Nausea. ? Aching all over your body.  Your symptoms do not improve, even with treatment. Get help right away if:  You have severe chest pain.  You have trouble walking or coordinating movements.  You are confused.  You lose vision or hearing.  You have numbness in your arms or legs.  You have a seizure.  You faint.  You have a severe headache that does not go away with medicine. Summary  Syphilis is an infection that can spread through sexual contact.  This condition can cause serious complications, so it is best to get treatment right away. The condition can be cured with antibiotic medicine.  This condition can also be spread from mother to fetus during pregnancy.  Take your antibiotic medicine as told by your health care provider.  Tell your recent sexual partners that you were diagnosed with syphilis. It is important that they get treatment, even if they do not have symptoms. This information is not intended to replace advice given to you by your health care provider. Make sure you discuss any questions you have with your health care provider. Document Revised: 05/04/2019 Document Reviewed: 12/17/2016 Elsevier Patient Education  2021 [employer].

## 2020-11-30 NOTE — Progress Notes (Signed)
GYN ENCOUNTER NOTE  Subjective:       Brandi Pearson is a 25 y.o. G0P0000 female originally schedule for evaluation of pelvic pain and vaginal discharge.   Seen in ER on 11/22/2020 prior to today's visit and diagnosed with PID and Syphilis. Completed appropriate treatment, see chart.   Reports nausea and occasional vomiting with medication, took doxycyline on an empty stomach this morning.   Denies difficulty breathing or respiratory distress, chest pain, abdominal pain, excessive vaginal bleeding, dysuria, and leg pain or swelling.     Gynecologic History  Patient's last menstrual period was 10/19/2020 (exact date).  Contraception: none  Last Pap: 12/2019. Results were: normal  Obstetric History  OB History  Gravida Para Term Preterm AB Living  0 0 0 0 0 0  SAB IAB Ectopic Multiple Live Births  0 0 0 0 0    Past Medical History:  Diagnosis Date  . Constipation   . STD (sexually transmitted disease)     Past Surgical History:  Procedure Laterality Date  . NO PAST SURGERIES      Current Outpatient Medications on File Prior to Visit  Medication Sig Dispense Refill  . doxycycline (VIBRA-TABS) 100 MG tablet Take 100 mg by mouth 2 (two) times daily.    Marland Kitchen HYDROcodone-acetaminophen (NORCO/VICODIN) 5-325 MG tablet Take 1-2 tablets by mouth every 6 (six) hours as needed for moderate pain or severe pain (no more than 6 tabs per day). 12 tablet 0  . nitrofurantoin, macrocrystal-monohydrate, (MACROBID) 100 MG capsule Take by mouth.    . ondansetron (ZOFRAN ODT) 4 MG disintegrating tablet Take 1 tablet (4 mg total) by mouth every 8 (eight) hours as needed for nausea or vomiting. 20 tablet 0  . metroNIDAZOLE (FLAGYL) 500 MG tablet Take 1 tablet (500 mg total) by mouth 3 (three) times daily for 10 days. (Patient not taking: Reported on 11/30/2020) 30 tablet 0  . nystatin cream (MYCOSTATIN) Apply topically 2 (two) times daily. (Patient not taking: No sig reported)     No current  facility-administered medications on file prior to visit.    Allergies  Allergen Reactions  . Shellfish Allergy Rash    Social History   Socioeconomic History  . Marital status: Single    Spouse name: Not on file  . Number of children: Not on file  . Years of education: Not on file  . Highest education level: Not on file  Occupational History  . Occupation: Consulting civil engineer  Tobacco Use  . Smoking status: Never Smoker  . Smokeless tobacco: Never Used  Vaping Use  . Vaping Use: Never used  Substance and Sexual Activity  . Alcohol use: No  . Drug use: Not Currently    Types: Marijuana  . Sexual activity: Not Currently    Partners: Male    Birth control/protection: Coitus interruptus    Comment: last sexually active in september  Other Topics Concern  . Not on file  Social History Narrative  . Not on file   Social Determinants of Health   Financial Resource Strain: Not on file  Food Insecurity: Not on file  Transportation Needs: Not on file  Physical Activity: Not on file  Stress: Not on file  Social Connections: Not on file  Intimate Partner Violence: Not on file    Family History  Problem Relation Age of Onset  . Colon cancer Paternal Grandfather   . Stroke Paternal Grandfather   . Breast cancer Neg Hx   . Ovarian cancer Neg Hx  The following portions of the patient's history were reviewed and updated as appropriate: allergies, current medications, past family history, past medical history, past social history, past surgical history and problem list.  Review of Systems  ROS negative except as noted above. Information obtained from patient.   Objective:   BP 106/75   Pulse 65   Ht 5\' 4"  (1.626 m)   Wt 184 lb 9 oz (83.7 kg)   LMP 10/19/2020 (Exact Date)   BMI 31.68 kg/m   CONSTITUTIONAL: Well-developed, well-nourished female in no acute distress.   PHYSICAL EXAM: Not indicated.    Recent Results (from the past 2160 hour(s))  POCT urine pregnancy      Status: None   Collection Time: 09/07/20  4:10 PM  Result Value Ref Range   Preg Test, Ur Negative Negative  Lipase, blood     Status: None   Collection Time: 11/22/20  5:03 PM  Result Value Ref Range   Lipase 22 11 - 51 U/L    Comment: Performed at [facility], [address] [place], [place]  Comprehensive metabolic panel     Status: Abnormal   Collection Time: 11/22/20  5:03 PM  Result Value Ref Range   Sodium 136 135 - 145 mmol/L   Potassium 3.6 3.5 - 5.1 mmol/L   Chloride 104 98 - 111 mmol/L   CO2 21 (L) 22 - 32 mmol/L   Glucose, Bld 94 70 - 99 mg/dL    Comment: Glucose reference range applies only to samples taken after fasting for at least 8 hours.   BUN 5 (L) 6 - 20 mg/dL   Creatinine, Ser 11/24/20 0.44 - 1.00 mg/dL   Calcium 9.2 8.9 - 4.81 mg/dL   Total Protein 8.1 6.5 - 8.1 g/dL   Albumin 3.8 3.5 - 5.0 g/dL   AST 22 15 - 41 U/L   ALT 8 0 - 44 U/L   Alkaline Phosphatase 71 38 - 126 U/L   Total Bilirubin 0.8 0.3 - 1.2 mg/dL   GFR, Estimated 85.6 >31 mL/min    Comment: (NOTE) Calculated using the CKD-EPI Creatinine Equation (2021)    Anion gap 11 5 - 15    Comment: Performed at [facility], [address] [place], [place]  CBC     Status: None   Collection Time: 11/22/20  5:03 PM  Result Value Ref Range   WBC 10.3 4.0 - 10.5 K/uL   RBC 4.70 3.87 - 5.11 MIL/uL   Hemoglobin 12.9 12.0 - 15.0 g/dL   HCT 11/24/20 78.5 - 88.5 %   MCV 84.0 80.0 - 100.0 fL   MCH 27.4 26.0 - 34.0 pg   MCHC 32.7 30.0 - 36.0 g/dL   RDW 02.7 74.1 - 28.7 %   Platelets 296 150 - 400 K/uL   nRBC 0.0 0.0 - 0.2 %    Comment: Performed at [facility], [address] [place], [place]  Urinalysis, Complete w Microscopic     Status: Abnormal   Collection Time: 11/22/20  7:16 PM  Result Value Ref Range   Color, Urine AMBER (A) YELLOW    Comment: BIOCHEMICALS MAY BE AFFECTED BY COLOR   APPearance CLOUDY (A) CLEAR   Specific Gravity, Urine  1.020 1.005 - 1.030   pH 5.0 5.0 - 8.0   Glucose, UA NEGATIVE NEGATIVE mg/dL   Hgb urine dipstick LARGE (A) NEGATIVE   Bilirubin Urine NEGATIVE NEGATIVE   Ketones, ur 20 (A)  NEGATIVE mg/dL   Protein, ur 709 (A) NEGATIVE mg/dL   Nitrite NEGATIVE NEGATIVE   Leukocytes,Ua MODERATE (A) NEGATIVE   RBC / HPF >50 (H) 0 - 5 RBC/hpf   WBC, UA >50 (H) 0 - 5 WBC/hpf   Bacteria, UA NONE SEEN NONE SEEN   Squamous Epithelial / LPF 0-5 0 - 5   WBC Clumps PRESENT     Comment: Performed at [facility], [address], [place], [place] [address]  POC urine preg, ED     Status: None   Collection Time: 11/22/20  7:20 PM  Result Value Ref Range   Preg Test, Ur Negative Negative  Fluorescent treponemal ab(fta)-IgG-bld     Status: Abnormal   Collection Time: 11/22/20  9:55 PM  Result Value Ref Range   Fluorescent Treponemal Ab, IgG Comment (A) Non Reactive    Comment: (NOTE)                 REACTIVE MINIMAL* In the absence of historical or clinical evidence of Treponemal infection this test result should be con- sidered EQUIVOCAL.  A second specimen should be sub- mitted for serologic testing. Performed At: [facility] [address] [place], [place] [id number] Jolene Schimke MD TK:[phone]       Assessment:   1. PID (pelvic inflammatory disease)   2. Positive RPR test   3. Nausea and vomiting, intractability of vomiting not specified, unspecified vomiting type   Plan:   Labs results and recommended follow up discussed with patient.   Advised to take antibiotic with food to decrease GI upset; pack of crackers given.   Reviewed red flag symptoms and when to call.   RTC as previously scheduled or sooner if needed.    Serafina Royals, CNM [facility], [facility] 11/30/20 1:15 PM

## 2020-12-05 DIAGNOSIS — Z419 Encounter for procedure for purposes other than remedying health state, unspecified: Secondary | ICD-10-CM | POA: Diagnosis not present

## 2020-12-28 ENCOUNTER — Other Ambulatory Visit: Payer: 59

## 2020-12-28 ENCOUNTER — Encounter: Payer: 59 | Admitting: Certified Nurse Midwife

## 2021-01-02 DIAGNOSIS — Z419 Encounter for procedure for purposes other than remedying health state, unspecified: Secondary | ICD-10-CM | POA: Diagnosis not present

## 2021-01-18 ENCOUNTER — Encounter: Payer: Self-pay | Admitting: Certified Nurse Midwife

## 2021-01-25 ENCOUNTER — Encounter: Payer: 59 | Admitting: Certified Nurse Midwife

## 2021-02-02 DIAGNOSIS — Z419 Encounter for procedure for purposes other than remedying health state, unspecified: Secondary | ICD-10-CM | POA: Diagnosis not present

## 2021-02-07 ENCOUNTER — Emergency Department
Admission: EM | Admit: 2021-02-07 | Discharge: 2021-02-07 | Disposition: A | Discharge status: Home / Self Care | Payer: 59 | Attending: Emergency Medicine | Admitting: Emergency Medicine

## 2021-02-07 ENCOUNTER — Encounter: Payer: Self-pay | Admitting: Emergency Medicine

## 2021-02-07 ENCOUNTER — Other Ambulatory Visit: Payer: Self-pay

## 2021-02-07 DIAGNOSIS — R110 Nausea: Secondary | ICD-10-CM | POA: Diagnosis not present

## 2021-02-07 DIAGNOSIS — R1032 Left lower quadrant pain: Secondary | ICD-10-CM | POA: Diagnosis present

## 2021-02-07 DIAGNOSIS — R509 Fever, unspecified: Secondary | ICD-10-CM | POA: Diagnosis not present

## 2021-02-07 DIAGNOSIS — Z5321 Procedure and treatment not carried out due to patient leaving prior to being seen by health care provider: Secondary | ICD-10-CM | POA: Diagnosis not present

## 2021-02-07 LAB — CBC
HCT: 37.4 % (ref 36.0–46.0)
Hemoglobin: 11.8 g/dL — ABNORMAL LOW (ref 12.0–15.0)
MCH: 26.9 pg (ref 26.0–34.0)
MCHC: 31.6 g/dL (ref 30.0–36.0)
MCV: 85.4 fL (ref 80.0–100.0)
Platelets: 322 10*3/uL (ref 150–400)
RBC: 4.38 MIL/uL (ref 3.87–5.11)
RDW: 12.7 % (ref 11.5–15.5)
WBC: 13.6 10*3/uL — ABNORMAL HIGH (ref 4.0–10.5)
nRBC: 0 % (ref 0.0–0.2)

## 2021-02-07 LAB — COMPREHENSIVE METABOLIC PANEL
ALT: 8 U/L (ref 0–44)
AST: 18 U/L (ref 15–41)
Albumin: 3.8 g/dL (ref 3.5–5.0)
Alkaline Phosphatase: 71 U/L (ref 38–126)
Anion gap: 8 (ref 5–15)
BUN: 8 mg/dL (ref 6–20)
CO2: 20 mmol/L — ABNORMAL LOW (ref 22–32)
Calcium: 9 mg/dL (ref 8.9–10.3)
Chloride: 111 mmol/L (ref 98–111)
Creatinine, Ser: 0.63 mg/dL (ref 0.44–1.00)
GFR, Estimated: >60 mL/min (ref >60)
Glucose, Bld: 83 mg/dL (ref 70–99)
Potassium: 4.4 mmol/L (ref 3.5–5.1)
Sodium: 139 mmol/L (ref 135–145)
Total Bilirubin: 0.5 mg/dL (ref 0.3–1.2)
Total Protein: 7.4 g/dL (ref 6.5–8.1)

## 2021-02-07 LAB — LIPASE, BLOOD: Lipase: 27 U/L (ref 11–51)

## 2021-02-07 NOTE — ED Notes (Signed)
No answer when called several times from lobby, no answer when phone # listed in chart called 

## 2021-02-07 NOTE — ED Triage Notes (Addendum)
Pt via POV from home. Pt c/o LLQ abdominal pain for couple hours today. Also c/o nausea without vomiting. Denies diarrhea. Pt has a hx of IBS. Denies any abdominal surgeries. Pt states she had a fever of 103.2, pt took Pamprin around 12:00pm. Pt is A&Ox4 but seems uncomfortable during triage.

## 2021-02-08 ENCOUNTER — Encounter: Payer: Self-pay | Admitting: Certified Nurse Midwife

## 2021-02-13 ENCOUNTER — Other Ambulatory Visit (HOSPITAL_COMMUNITY)
Admission: RE | Admit: 2021-02-13 | Discharge: 2021-02-13 | Disposition: A | Discharge status: Home / Self Care | Payer: 59 | Source: Ambulatory Visit | Attending: Certified Nurse Midwife | Admitting: Certified Nurse Midwife

## 2021-02-13 ENCOUNTER — Ambulatory Visit (INDEPENDENT_AMBULATORY_CARE_PROVIDER_SITE_OTHER): Payer: 59 | Admitting: Certified Nurse Midwife

## 2021-02-13 ENCOUNTER — Encounter: Payer: Self-pay | Admitting: Certified Nurse Midwife

## 2021-02-13 ENCOUNTER — Other Ambulatory Visit: Payer: Self-pay

## 2021-02-13 VITALS — BP 107/74 | HR 80 | Resp 16 | Ht 64.0 in | Wt 184.4 lb

## 2021-02-13 DIAGNOSIS — N898 Other specified noninflammatory disorders of vagina: Secondary | ICD-10-CM

## 2021-02-13 DIAGNOSIS — Z113 Encounter for screening for infections with a predominantly sexual mode of transmission: Secondary | ICD-10-CM

## 2021-02-13 DIAGNOSIS — N760 Acute vaginitis: Secondary | ICD-10-CM | POA: Diagnosis not present

## 2021-02-13 DIAGNOSIS — B9689 Other specified bacterial agents as the cause of diseases classified elsewhere: Secondary | ICD-10-CM | POA: Diagnosis not present

## 2021-02-13 MED ORDER — FLUCONAZOLE 150 MG PO TABS: 150.0000 mg | ORAL_TABLET | Freq: Once | ORAL | 0 refills | Status: AC

## 2021-02-13 NOTE — Patient Instructions (Signed)
Vaginitis  Vaginitis is a condition in which the vaginal tissue swells and becomes irritated. This condition is most often caused by a change in the normal balance of bacteria and yeast that live in the vagina. This change causes an overgrowth of certain bacteria or yeast, which causes the inflammation. There are different types of vaginitis. What are the causes? The cause of this condition depends on the type of vaginitis. It can be caused by:  Bacteria (bacterial vaginosis).  Yeast, which is a fungus (candidiasis).  A parasite (trichomoniasis vaginitis).  A virus (viral vaginitis).  Low hormone levels (atrophic vaginitis). Low hormone levels can occur during pregnancy, breastfeeding, or after menopause.  Irritants, such as bubble baths, scented tampons, and feminine sprays (allergic vaginitis). Other factors can change the normal balance of the yeast and bacteria that live in the vagina. These include:  Antibiotic medicines.  Poor hygiene.  Diaphragms, vaginal sponges, spermicides, birth control pills, and intrauterine devices (IUDs).  Sex.  Infection.  Uncontrolled diabetes.  A weakened body defense system (immune system). What increases the risk? This condition is more likely to develop in women who:  Smoke or are exposed to secondhand smoke.  Use vaginal douches, scented tampons, or scented sanitary pads.  Wear tight-fitting pants or thong underwear.  Use oral birth control pills or an IUD.  Have sex without a condom or have multiple partners.  Have an STI.  Frequently use the spermicide nonoxynol-9.  Eat lots of foods high in sugar or who have uncontrolled diabetes.  Have low estrogen levels.  Have a weakened immune system from an immune disorder or medical treatment.  Are pregnant or breastfeeding. What are the signs or symptoms? Symptoms vary depending on the cause of the vaginitis. Common symptoms include:  Abnormal vaginal discharge. ? The  discharge is white, gray, or yellow with bacterial vaginosis. ? The discharge is thick, white, and cheesy with a yeast infection. ? The discharge is frothy and yellow or greenish with trichomoniasis.  A bad vaginal smell. The smell is fishy with bacterial vaginosis.  Vaginal itching, pain, or swelling.  Pain with sex.  Pain or burning when urinating. Sometimes there are no symptoms. How is this diagnosed? This condition is diagnosed based on your symptoms and medical history. A physical exam, including a pelvic exam, will also be done. You may also have other tests, including:  Tests to determine the pH level (acidity or alkalinity) of your vagina.  A whiff test to assess the odor that results when a sample of your vaginal discharge is mixed with a potassium hydroxide solution.  Tests of vaginal fluid. A sample will be examined under a microscope. How is this treated? Treatment varies depending on the type of vaginitis you have. Your treatment may include:  Antibiotic creams or pills to treat bacterial vaginosis and trichomoniasis.  Antifungal medicines, such as vaginal creams or suppositories, to treat a yeast infection.  Medicine to ease discomfort if you have viral vaginitis. Your sexual partner should also be treated.  Estrogen delivered in a cream, pill, suppository, or vaginal ring to treat atrophic vaginitis. If vaginal dryness occurs, lubricants and moisturizing creams may help. You may need to avoid scented soaps, sprays, or douches.  Stopping use of a product that is causing allergic vaginitis and then using a vaginal cream to treat the symptoms. Follow these instructions at home: Lifestyle  Keep your genital area clean and dry. Avoid soap, and only rinse the area with water.  Do not douche   or use tampons until your health care provider says it is okay. Use sanitary pads, if needed.  Do not have sex until your health care provider approves. When you can return to sex,  practice safe sex and use condoms.  Wipe from front to back. This avoids the spread of bacteria from the rectum to the vagina. General instructions  Take over-the-counter and prescription medicines only as told by your health care provider.  If you were prescribed an antibiotic medicine, take or use it as told by your health care provider. Do not stop taking or using the antibiotic even if you start to feel better.  Keep all follow-up visits. This is important. How is this prevented?  Use mild, unscented products. Do not use things that can irritate the vagina, such as fabric softeners. Avoid the following products if they are scented: ? Feminine sprays. ? Detergents. ? Tampons. ? Feminine hygiene products. ? Soaps or bubble baths.  Let air reach your genital area. To do this: ? Wear cotton underwear to reduce moisture buildup. ? Avoid wearing underwear while you sleep. ? Avoid wearing tight pants and underwear or nylons without a cotton panel. ? Avoid wearing thong underwear.  Take off any wet clothing, such as bathing suits, as soon as possible.  Practice safe sex and use condoms. Contact a health care provider if:  You have abdominal or pelvic pain.  You have a fever or chills.  You have symptoms that last for more than 2-3 days. Get help right away if:  You have a fever and your symptoms suddenly get worse. Summary  Vaginitis is a condition in which the vaginal tissue becomes inflamed.This condition is most often caused by a change in the normal balance of bacteria and yeast that live in the vagina.  Treatment varies depending on the type of vaginitis you have.  Do not douche, use tampons, or have sex until your health care provider approves. When you can return to sex, practice safe sex and use condoms. This information is not intended to replace advice given to you by your health care provider. Make sure you discuss any questions you have with your health care  provider. Document Revised: 04/20/2020 Document Reviewed: 04/20/2020 Elsevier Patient Education  2021 [employer].  

## 2021-02-13 NOTE — Addendum Note (Signed)
Addended by: Mechele Claude on: 02/13/2021 05:00 PM   Modules accepted: Orders

## 2021-02-13 NOTE — Progress Notes (Signed)
GYN ENCOUNTER NOTE  Subjective:       Brandi Pearson is a 25 y.o. G0P0000 female is here for gynecologic evaluation of the following issues:  1. Increased vaginal irritation. Pt state she was recently on antibiotics for upper respiratory infections and state she is very sensitive with yeast infections. She is also requesting STD testing today.      Gynecologic History No LMP recorded. Contraception: coitus interruptus Last Pap: 12/2019 Results were: normal Last mammogram: n/a .   Obstetric History OB History  Gravida Para Term Preterm AB Living  0 0 0 0 0 0  SAB IAB Ectopic Multiple Live Births  0 0 0 0 0    Past Medical History:  Diagnosis Date  . Constipation   . STD (sexually transmitted disease)     Past Surgical History:  Procedure Laterality Date  . NO PAST SURGERIES      Current Outpatient Medications on File Prior to Visit  Medication Sig Dispense Refill  . fluconazole (DIFLUCAN) 150 MG tablet Take 150 mg by mouth once. (Patient not taking: Reported on 02/13/2021)     No current facility-administered medications on file prior to visit.    Allergies  Allergen Reactions  . Shellfish Allergy Rash    Social History   Socioeconomic History  . Marital status: Single    Spouse name: Not on file  . Number of children: Not on file  . Years of education: Not on file  . Highest education level: Not on file  Occupational History  . 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 Smoking status: Never Smoker  . Smokeless tobacco: Never Used  Vaping Use  . Vaping Use: Never used  Substance and Sexual Activity  . Alcohol use: No  . Drug use: Not Currently    Types: Marijuana  . Sexual activity: Not Currently    Partners: Male    Birth control/protection: Coitus interruptus    Comment: last sexually active in september  Other Topics Concern  . Not on file  Social History Narrative  . Not on file   Social Determinants of Health    Financial Resource Strain: Not on file  Food Insecurity: Not on file  Transportation Needs: Not on file  Physical Activity: Not on file  Stress: Not on file  Social Connections: Not on file  Intimate Partner Violence: Not on file    Family History  Problem Relation Age of Onset  . Colon cancer Paternal Grandfather   . Stroke Paternal Grandfather   . Breast cancer Neg Hx   . Ovarian cancer Neg Hx     The following portions of the patient's history were reviewed and updated as appropriate: allergies, current medications, past family history, past medical history, past social history, past surgical history and problem list.  Review of Systems Review of Systems - Negative except as mentioned in hpi Review of Systems - General ROS: negative for - chills, fatigue, fever, hot flashes, malaise or night sweats Hematological and Lymphatic ROS: negative for - bleeding problems or swollen lymph nodes Gastrointestinal ROS: negative for - abdominal pain, blood in stools, change in bowel habits and nausea/vomiting Musculoskeletal ROS: negative for - joint pain, muscle pain or muscular weakness Genito-Urinary ROS: negative for - change in menstrual cycle, dysmenorrhea, dyspareunia, dysuria, genital discharge, genital ulcers, hematuria, incontinence, irregular/heavy menses, nocturia or pelvic pain. Positive for vaginal irritation   Objective:   BP 107/74   Pulse 80  Resp 16   Ht 5\' 4"  (1.626 m)   Wt 184 lb 6.4 oz (83.6 kg)   BMI 31.65 kg/m  CONSTITUTIONAL: Well-developed, well-nourished female in no acute distress.  HENT:  Normocephalic, atraumatic.  NECK: Normal range of motion, supple, no masses.  Normal thyroid.  SKIN: Skin is warm and dry. No rash noted. Not diaphoretic. No erythema. No pallor. NEUROLGIC: Alert and oriented to person, place, and time. PSYCHIATRIC: Normal mood and affect. Normal behavior. Normal judgment and thought content. CARDIOVASCULAR:Not Examined RESPIRATORY:  Not Examined BREASTS: Not Examined ABDOMEN: Soft, non distended; Non tender.  No Organomegaly. PELVIC: self swab completed  MUSCULOSKELETAL: Normal range of motion. No tenderness.  No cyanosis, clubbing, or edema.     Assessment:   1. Screen for STD (sexually transmitted disease)  - Cervicovaginal ancillary only - RPR - HIV Antibody (routine testing w rflx) - Hepatitis B surface antigen - HSV 1 AND 2 IGM ABS, INDIRECT - Hepatitis C Antibody  2. Vaginal itching - Cervicovaginal ancillary only     Plan:   Discussed use of boric acid vaginal suppositories after use of antibiotics, intercourse , and or after menstrual cycle to decrease occurrence of vaginal infections.Discussed self help measures to decrease occurrence of vaginal infection. She verbalizes understanding.    Face to face time 10 min.  Follow up prn or for annual with [facility].   [facility], CNM

## 2021-02-14 ENCOUNTER — Other Ambulatory Visit: Payer: Self-pay | Admitting: Certified Nurse Midwife

## 2021-02-14 DIAGNOSIS — A64 Unspecified sexually transmitted disease: Secondary | ICD-10-CM

## 2021-02-14 LAB — HSV(HERPES SIMPLEX VRS) I + II AB-IGG
HSV 1 Glycoprotein G Ab, IgG: <0.91 index (ref 0.00–0.90)
HSV 2 IgG, Type Spec: 9.57 index — ABNORMAL HIGH (ref 0.00–0.90)

## 2021-02-14 LAB — HEPATITIS B SURFACE ANTIGEN: Hepatitis B Surface Ag: NEGATIVE

## 2021-02-14 LAB — HIV ANTIBODY (ROUTINE TESTING W REFLEX): HIV Screen 4th Generation wRfx: NONREACTIVE

## 2021-02-14 LAB — HEPATITIS C ANTIBODY: Hep C Virus Ab: <0.1 s/co ratio (ref 0.0–0.9)

## 2021-02-14 LAB — RPR: RPR Ser Ql: NONREACTIVE

## 2021-02-14 MED ORDER — VALACYCLOVIR HCL 1 G PO TABS: 1000.0000 mg | ORAL_TABLET | Freq: Two times a day (BID) | ORAL | 3 refills | Status: AC

## 2021-02-14 NOTE — Telephone Encounter (Signed)
Called pt to discussed diagnosis of HSV. All questions answered. PT requesting confirmatory testing. Orders placed for lab draw , she will call to schedule over the next few months. Discussed treatment for out breaks, orders placed for valtrex. Reassurance given. She verbalizes understanding and agree. Follow up prn .  Doreene Burke, CNM

## 2021-02-15 LAB — CERVICOVAGINAL ANCILLARY ONLY
Bacterial Vaginitis (gardnerella): POSITIVE — AB
Candida Glabrata: NEGATIVE
Candida Vaginitis: NEGATIVE
Chlamydia: NEGATIVE
Comment: NEGATIVE
Comment: NEGATIVE
Comment: NEGATIVE
Comment: NEGATIVE
Comment: NEGATIVE
Comment: NORMAL
Neisseria Gonorrhea: NEGATIVE
Trichomonas: NEGATIVE

## 2021-02-19 ENCOUNTER — Other Ambulatory Visit: Payer: Self-pay | Admitting: Certified Nurse Midwife

## 2021-02-19 MED ORDER — METRONIDAZOLE 500 MG PO TABS: 500.0000 mg | ORAL_TABLET | Freq: Two times a day (BID) | ORAL | 0 refills | Status: AC

## 2021-02-19 NOTE — Progress Notes (Signed)
Swab positive for BV , orders placed for treatment. Pt notified via my chart.   Brandi Pearson, CNM 

## 2021-02-21 ENCOUNTER — Other Ambulatory Visit: Payer: Self-pay

## 2021-02-21 ENCOUNTER — Encounter: Payer: Self-pay | Admitting: Obstetrics and Gynecology

## 2021-02-21 ENCOUNTER — Ambulatory Visit (INDEPENDENT_AMBULATORY_CARE_PROVIDER_SITE_OTHER): Payer: 59 | Admitting: Obstetrics and Gynecology

## 2021-02-21 VITALS — BP 116/75 | HR 80 | Ht 64.0 in | Wt 188.8 lb

## 2021-02-21 DIAGNOSIS — N912 Amenorrhea, unspecified: Secondary | ICD-10-CM | POA: Diagnosis not present

## 2021-02-21 DIAGNOSIS — N760 Acute vaginitis: Secondary | ICD-10-CM

## 2021-02-21 DIAGNOSIS — Z01419 Encounter for gynecological examination (general) (routine) without abnormal findings: Secondary | ICD-10-CM

## 2021-02-21 DIAGNOSIS — B9689 Other specified bacterial agents as the cause of diseases classified elsewhere: Secondary | ICD-10-CM

## 2021-02-21 DIAGNOSIS — Z9289 Personal history of other medical treatment: Secondary | ICD-10-CM

## 2021-02-21 DIAGNOSIS — B009 Herpesviral infection, unspecified: Secondary | ICD-10-CM | POA: Diagnosis not present

## 2021-02-21 LAB — POCT URINE PREGNANCY: Preg Test, Ur: NEGATIVE

## 2021-02-21 NOTE — Addendum Note (Signed)
Addended by: Dorian Pod on: 02/21/2021 08:33 AM   Modules accepted: Orders

## 2021-02-21 NOTE — Progress Notes (Signed)
HPI:      [name] is a 25 y.o. G0P0000 who LMP was Patient's last menstrual period was 02/06/2021 (approximate).  Subjective:   She presents today for her annual examination.  Her main concern today is that she would like to discuss the last month of medical care which includes a diagnosis of syphilis, herpes, PID and most recently BV. She has been appropriately treated for all of the above and has tested negative most recently for GC chlamydia and syphilis.  She reports that she has never had any type of vaginal/vulvar outbreak consistent with herpes.  She is hesitant to take the Valtrex because she says she has never had an outbreak.  She has not yet begun metronidazole for BV. She is not using birth control and would like a pregnancy test today.  She states that she does not desire birth control but does not desire pregnancy.  She reports that she will abstain from sex as a method of birth control. She has not been vaccinated against COVID and is hesitant to be vaccinated.    Hx: The following portions of the patient's history were reviewed and updated as appropriate:             She  has a past medical history of Constipation and STD (sexually transmitted disease). She does not have any pertinent problems on file. She  has a past surgical history that includes No past surgeries. Her family history includes Colon cancer in her paternal grandfather; Stroke in her paternal grandfather. She  reports that she has never smoked. She has never used smokeless tobacco. She reports previous drug use. Drug: Marijuana. She reports that she does not drink alcohol. She has a current medication list which includes the following prescription(s): valacyclovir and metronidazole. She is allergic to shellfish allergy.       Review of Systems:  Review of Systems  Constitutional: Denied constitutional symptoms, night sweats, recent illness, fatigue, fever, insomnia and weight loss.  Eyes: Denied eye  symptoms, eye pain, photophobia, vision change and visual disturbance.  Ears/Nose/Throat/Neck: Denied ear, nose, throat or neck symptoms, hearing loss, nasal discharge, sinus congestion and sore throat.  Cardiovascular: Denied cardiovascular symptoms, arrhythmia, chest pain/pressure, edema, exercise intolerance, orthopnea and palpitations.  Respiratory: Denied pulmonary symptoms, asthma, pleuritic pain, productive sputum, cough, dyspnea and wheezing.  Gastrointestinal: Denied, gastro-esophageal reflux, melena, nausea and vomiting.  Genitourinary: Denied genitourinary symptoms including symptomatic vaginal discharge, pelvic relaxation issues, and urinary complaints.  Musculoskeletal: Denied musculoskeletal symptoms, stiffness, swelling, muscle weakness and myalgia.  Dermatologic: Denied dermatology symptoms, rash and scar.  Neurologic: Denied neurology symptoms, dizziness, headache, neck pain and syncope.  Psychiatric: Denied psychiatric symptoms, anxiety and depression.  Endocrine: Denied endocrine symptoms including hot flashes and night sweats.   Meds:   Current Outpatient Medications on File Prior to Visit  Medication Sig Dispense Refill  . valACYclovir (VALTREX) 1000 MG tablet Take 1 tablet (1,000 mg total) by mouth 2 (two) times daily for 10 days. (Patient not taking: Reported on 02/21/2021) 20 tablet 3  . metroNIDAZOLE (FLAGYL) 500 MG tablet Take 1 tablet (500 mg total) by mouth 2 (two) times daily for 7 days. (Patient not taking: Reported on 02/21/2021) 14 tablet 0   No current facility-administered medications on file prior to visit.       The pregnancy intention screening data noted above was reviewed. Potential methods of contraception were discussed. The patient elected to proceed with Abstinence.     Objective:  Vitals:   02/21/21 0739  BP: 116/75  Pulse: 80    Filed Weights   02/21/21 0739  Weight: 188 lb 12.8 oz (85.6 kg)              Physical  examination General NAD, Conversant  HEENT Atraumatic; Op clear with mmm.  Normo-cephalic. Pupils reactive. Anicteric sclerae  Thyroid/Neck Smooth without nodularity or enlargement. Normal ROM.  Neck Supple.  Skin No rashes, lesions or ulceration. Normal palpated skin turgor. No nodularity.  Breasts: No masses or discharge.  Symmetric.  No axillary adenopathy.  Lungs: Clear to auscultation.No rales or wheezes. Normal Respiratory effort, no retractions.  Heart: NSR.  No murmurs or rubs appreciated. No periferal edema  Abdomen: Soft.  Non-tender.  No masses.  No HSM. No hernia  Extremities: Moves all appropriately.  Normal ROM for age. No lymphadenopathy.  Neuro: Oriented to PPT.  Normal mood. Normal affect.     Pelvic:   Vulva: Normal appearance.  No lesions.  Vagina: No lesions or abnormalities noted.  Support: Normal pelvic support.  Urethra No masses tenderness or scarring.  Meatus Normal size without lesions or prolapse.  Cervix: Normal appearance.  No lesions.  Anus: Normal exam.  No lesions.  Perineum: Normal exam.  No lesions.        Bimanual   Uterus: Normal size.  Non-tender.  Mobile.  AV.  Adnexae: No masses.  Non-tender to palpation.  Cul-de-sac: Negative for abnormality.      Assessment:    G0P0000 Patient Active Problem List   Diagnosis Date Noted  . PID (pelvic inflammatory disease) 11/30/2020  . 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 Dysmenorrhea 01/14/2020  . Pelvic pain 01/14/2020  . MDD (major depressive disorder), recurrent episode, severe (HCC) 01/11/2013  . PTSD (post-traumatic stress disorder) 01/11/2013  . 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 Well woman exam with routine gynecological exam   2. HSV-2 infection   3. History of RPR test   4. Bacterial vulvovaginitis     Patient asymptomatic at this time.  Most recent testing shows all negative STDs.  Positive for BV.   Plan:            1.  Basic Screening Recommendations The basic screening  recommendations for asymptomatic women were discussed with the patient during her visit.  The age-appropriate recommendations were discussed with her and the rational for the tests reviewed.  When I am informed by the patient that another primary care physician has previously obtained the age-appropriate tests and they are up-to-date, only outstanding tests are ordered and referrals given as necessary.  Abnormal results of tests will be discussed with her when all of her results are completed.  Routine preventative health maintenance measures emphasized: Exercise/Diet/Weight control, Tobacco Warnings, Alcohol/Substance use risks and Stress Management 2.  I have reviewed her results and medical care for the last 2 months with her.  I have discussed HSV, RPR, PID and BV with her.  All questions answered.  At this point she is negative for everything except for BV. 3.  Recommend metronidazole already prescribed for BV. 4.  Recommended birth control methods but patient declined saying she will abstain from intercourse. 5.  Pregnancy test today at patient request. Orders No orders of the defined types were placed in this encounter.   No orders of the defined types were placed in this encounter.         F/U  Return in about 1 year (around 02/21/2022) for  Annual Physical. I spent 22 extra minutes involved in the care of this patient preparing to see the patient by obtaining and reviewing her medical history (including labs, imaging tests and prior procedures) especially as it relates to her previous STD exposures and current symptoms.  We have discussed these at great length as noted above.  I also spent additional time documenting clinical information in the electronic health record (EHR), counseling and coordinating care plans, writing and sending prescriptions, ordering tests or procedures and directly communicating with the patient by discussing pertinent items from her history and physical exam as well as  detailing my assessment and plan as noted above so that she has an informed understanding.  All of her questions were answered.  Elonda Husky, M.D. 02/21/2021 8:26 AM

## 2021-03-04 DIAGNOSIS — Z419 Encounter for procedure for purposes other than remedying health state, unspecified: Secondary | ICD-10-CM | POA: Diagnosis not present

## 2021-04-04 DIAGNOSIS — Z419 Encounter for procedure for purposes other than remedying health state, unspecified: Secondary | ICD-10-CM | POA: Diagnosis not present

## 2021-05-04 DIAGNOSIS — Z419 Encounter for procedure for purposes other than remedying health state, unspecified: Secondary | ICD-10-CM | POA: Diagnosis not present

## 2021-06-04 DIAGNOSIS — Z419 Encounter for procedure for purposes other than remedying health state, unspecified: Secondary | ICD-10-CM | POA: Diagnosis not present

## 2021-06-26 ENCOUNTER — Encounter: Payer: Self-pay | Admitting: Emergency Medicine

## 2021-06-26 ENCOUNTER — Ambulatory Visit
Admission: EM | Admit: 2021-06-26 | Discharge: 2021-06-26 | Disposition: A | Discharge status: Home / Self Care | Payer: 59 | Attending: Emergency Medicine | Admitting: Emergency Medicine

## 2021-06-26 ENCOUNTER — Other Ambulatory Visit: Payer: Self-pay

## 2021-06-26 DIAGNOSIS — B349 Viral infection, unspecified: Secondary | ICD-10-CM | POA: Diagnosis not present

## 2021-06-26 DIAGNOSIS — Z1152 Encounter for screening for COVID-19: Secondary | ICD-10-CM | POA: Diagnosis not present

## 2021-06-26 NOTE — Discharge Instructions (Addendum)
Your COVID and Flu tests are pending.  You should self quarantine until the test results are back.    Take Tylenol or ibuprofen as needed for fever or discomfort.  Rest and keep yourself hydrated.    Follow-up with your primary care provider if your symptoms are not improving.     

## 2021-06-26 NOTE — ED Triage Notes (Signed)
Pt here with body aches, chills, subjective fever, sore throat, headache and tinnitis since yesterday. Had covid last fall. Noone else is sick in house hold.

## 2021-06-26 NOTE — ED Provider Notes (Signed)
[name]    CSN: [id number] Arrival date & time: 06/26/21  1533      History   Chief Complaint Chief Complaint  Patient presents with   Sore Throat   Nasal Congestion   Generalized Body Aches   Fever   Fatigue    HPI Brandi Pearson is a 25 y.o. female.  Patient presents with 1 day history of fever, chills, body aches, headache, ringing in her ears, sore throat, cough, shortness of breath, nausea.  She denies rash, vomiting, diarrhea, or other symptoms.  No treatments attempted at home.  Her medical history includes dissociative fugue, PTSD, depression.    The history is provided by the patient and medical records.   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    Past Surgical History:  Procedure Laterality Date   NO PAST SURGERIES      OB History     Gravida  0   Para  0   Term  0   Preterm  0   AB  0   Living  0      SAB  0   IAB  0   Ectopic  0   Multiple  0   Live Births  0            Home Medications    Prior to Admission medications   Not on File    Family History Family History  Problem Relation Age of Onset   Colon cancer Paternal Grandfather    Stroke Paternal Grandfather    Breast cancer Neg Hx    Ovarian cancer Neg Hx     Social History Social History   Tobacco Use   Smoking status: Never   Smokeless tobacco: Never  Vaping Use   Vaping Use: Never used  Substance Use Topics   Alcohol use: No   Drug use: Not Currently    Types: Marijuana     Allergies   Shellfish allergy   Review of  Systems Review of Systems  Constitutional:  Positive for chills and fever.  HENT:  Positive for sore throat and tinnitus. Negative for ear pain.   Respiratory:  Positive for cough and shortness of breath.   Cardiovascular:  Negative for chest pain and palpitations.  Gastrointestinal:  Negative for abdominal pain, diarrhea and vomiting.  Skin:  Negative for color change and rash.  All other systems reviewed and are negative.   Physical Exam Triage Vital Signs ED Triage Vitals  Enc Vitals Group     BP 06/26/21 1544 121/75     Pulse Rate 06/26/21 1544 98     Resp 06/26/21 1544 (!) 22     Temp 06/26/21 1544 97.9 F (36.6 C)     Temp Source 06/26/21 1544 Oral     SpO2 06/26/21 1544 96 %     Weight --      Height --      Head Circumference --      Peak Flow --      Pain Score 06/26/21 1550 10     Pain Loc --  Pain Edu? --      Excl. in GC? --    No data found.  Updated Vital Signs BP 121/75 (BP Location: Left Arm)   Pulse 98   Temp 97.9 F (36.6 C) (Oral)   Resp (!) 22   SpO2 96%   Visual Acuity Right Eye Distance:   Left Eye Distance:   Bilateral Distance:    Right Eye Near:   Left Eye Near:    Bilateral Near:     Physical Exam Vitals and nursing note reviewed.  Constitutional:      General: She is not in acute distress.    Appearance: She is well-developed. She is ill-appearing.  HENT:     Head: Normocephalic and atraumatic.     Right Ear: Tympanic membrane normal.     Left Ear: Tympanic membrane normal.     Nose: Nose normal.     Mouth/Throat:     Mouth: Mucous membranes are moist.     Pharynx: Oropharynx is clear.  Eyes:     Conjunctiva/sclera: Conjunctivae normal.  Cardiovascular:     Rate and Rhythm: Normal rate and regular rhythm.     Heart sounds: Normal heart sounds.  Pulmonary:     Effort: Pulmonary effort is normal. No respiratory distress.     Breath sounds: Normal breath sounds.  Abdominal:     Palpations: Abdomen is soft.      Tenderness: There is no abdominal tenderness.  Musculoskeletal:     Cervical back: Neck supple.  Skin:    General: Skin is warm and dry.  Neurological:     General: No focal deficit present.     Mental Status: She is alert and oriented to person, place, and time.     Gait: Gait normal.  Psychiatric:        Mood and Affect: Mood normal.        Behavior: Behavior normal.     UC Treatments / Results  Labs (all labs ordered are listed, but only abnormal results are displayed) Labs Reviewed  COVID-19, FLU A+B NAA    EKG   Radiology No results found.  Procedures Procedures (including critical care time)  Medications Ordered in UC Medications - No data to display  Initial Impression / Assessment and Plan / UC Course  I have reviewed the triage vital signs and the nursing notes.  Pertinent labs & imaging results that were available during my care of the patient were reviewed by me and considered in my medical decision making (see chart for details).   Viral illness.  COVID and Flu pending.  Instructed patient to self quarantine per CDC guidelines.  Discussed symptomatic treatment including Tylenol or ibuprofen, rest, hydration.  Instructed patient to follow up with PCP if symptoms are not improving.  Patient agrees to plan of care.    Final Clinical Impressions(s) / UC Diagnoses   Final diagnoses:  Encounter for screening for COVID-19  Viral illness     Discharge Instructions      Your COVID and Flu tests are pending.  You should self quarantine until the test results are back.    Take Tylenol or ibuprofen as needed for fever or discomfort.  Rest and keep yourself hydrated.    Follow-up with your primary care provider if your symptoms are not improving.         ED Prescriptions   None    PDMP not reviewed this encounter.   Mickie Bail, NP 06/26/21 1620

## 2021-06-28 LAB — COVID-19, FLU A+B NAA
Influenza A, NAA: NOT DETECTED
Influenza B, NAA: NOT DETECTED
SARS-CoV-2, NAA: DETECTED — AB

## 2021-07-05 DIAGNOSIS — Z419 Encounter for procedure for purposes other than remedying health state, unspecified: Secondary | ICD-10-CM | POA: Diagnosis not present

## 2021-08-04 DIAGNOSIS — Z419 Encounter for procedure for purposes other than remedying health state, unspecified: Secondary | ICD-10-CM | POA: Diagnosis not present

## 2021-09-04 DIAGNOSIS — Z419 Encounter for procedure for purposes other than remedying health state, unspecified: Secondary | ICD-10-CM | POA: Diagnosis not present

## 2021-10-04 DIAGNOSIS — Z419 Encounter for procedure for purposes other than remedying health state, unspecified: Secondary | ICD-10-CM | POA: Diagnosis not present

## 2021-11-04 DIAGNOSIS — Z419 Encounter for procedure for purposes other than remedying health state, unspecified: Secondary | ICD-10-CM | POA: Diagnosis not present

## 2021-11-06 ENCOUNTER — Other Ambulatory Visit: Payer: Self-pay | Admitting: Family Medicine

## 2021-11-06 ENCOUNTER — Ambulatory Visit
Admission: RE | Admit: 2021-11-06 | Discharge: 2021-11-06 | Disposition: A | Discharge status: Home / Self Care | Payer: 59 | Source: Ambulatory Visit | Attending: Family Medicine | Admitting: Family Medicine

## 2021-11-06 ENCOUNTER — Other Ambulatory Visit: Payer: Self-pay

## 2021-11-06 DIAGNOSIS — M542 Cervicalgia: Secondary | ICD-10-CM

## 2021-11-06 DIAGNOSIS — G44319 Acute post-traumatic headache, not intractable: Secondary | ICD-10-CM | POA: Insufficient documentation

## 2021-11-06 DIAGNOSIS — W19XXXA Unspecified fall, initial encounter: Secondary | ICD-10-CM

## 2021-11-06 DIAGNOSIS — S098XXA Other specified injuries of head, initial encounter: Secondary | ICD-10-CM | POA: Insufficient documentation

## 2021-12-05 DIAGNOSIS — Z419 Encounter for procedure for purposes other than remedying health state, unspecified: Secondary | ICD-10-CM | POA: Diagnosis not present

## 2022-01-02 DIAGNOSIS — Z419 Encounter for procedure for purposes other than remedying health state, unspecified: Secondary | ICD-10-CM | POA: Diagnosis not present

## 2022-02-02 DIAGNOSIS — Z419 Encounter for procedure for purposes other than remedying health state, unspecified: Secondary | ICD-10-CM | POA: Diagnosis not present

## 2022-03-04 DIAGNOSIS — Z419 Encounter for procedure for purposes other than remedying health state, unspecified: Secondary | ICD-10-CM | POA: Diagnosis not present

## 2022-04-04 DIAGNOSIS — Z419 Encounter for procedure for purposes other than remedying health state, unspecified: Secondary | ICD-10-CM | POA: Diagnosis not present

## 2022-04-20 ENCOUNTER — Ambulatory Visit
Admission: EM | Admit: 2022-04-20 | Discharge: 2022-04-20 | Disposition: A | Discharge status: Home / Self Care | Payer: 59 | Attending: Emergency Medicine | Admitting: Emergency Medicine

## 2022-04-20 ENCOUNTER — Other Ambulatory Visit: Payer: Self-pay

## 2022-04-20 ENCOUNTER — Encounter: Payer: Self-pay | Admitting: Emergency Medicine

## 2022-04-20 DIAGNOSIS — R509 Fever, unspecified: Secondary | ICD-10-CM

## 2022-04-20 DIAGNOSIS — R110 Nausea: Secondary | ICD-10-CM

## 2022-04-20 DIAGNOSIS — B349 Viral infection, unspecified: Secondary | ICD-10-CM | POA: Diagnosis not present

## 2022-04-20 LAB — POCT RAPID STREP A (OFFICE): Rapid Strep A Screen: NEGATIVE

## 2022-04-20 MED ORDER — ONDANSETRON 4 MG PO TBDP
4.0000 mg | ORAL_TABLET | Freq: Once | ORAL | Status: AC
  Administered 2022-04-20: 4 mg via ORAL

## 2022-04-20 MED ORDER — ONDANSETRON 4 MG PO TBDP: 4.0000 mg | ORAL_TABLET | Freq: Three times a day (TID) | ORAL | 0 refills | Status: DC | PRN

## 2022-04-20 MED ORDER — ACETAMINOPHEN 325 MG PO TABS
650.0000 mg | ORAL_TABLET | Freq: Once | ORAL | Status: AC
  Administered 2022-04-20: 650 mg via ORAL

## 2022-04-20 NOTE — ED Triage Notes (Addendum)
Fever and body aches started on 6/15. Complains of headache and neck pain, both ears hurting  Nausea, no vomiting.  Mentioned throat soreness as nurse leaving room

## 2022-04-20 NOTE — ED Provider Notes (Signed)
Brandi Pearson    CSN: [id number] Arrival date & time: 04/20/22  1242      History   Chief Complaint Chief Complaint  Patient presents with   Fever    HPI Brandi Pearson is a 26 y.o. female.  Patient presents with 2-day history of fever, body aches, headache, ear pain, sore throat, neck pain, nausea.  No rash, cough, shortness of breath, vomiting, diarrhea, or other symptoms.  No treatments at home today; took Midol last night.  The history is provided by the patient and medical records.    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 Authorizing Provider  ondansetron (ZOFRAN-ODT) 4 MG disintegrating tablet Take 1 tablet (4 mg total) by mouth every 8 (eight) hours as needed for nausea or vomiting. 04/20/22  Yes Mickie Bail, NP    Family History Family History  Problem Relation Age of Onset   Colon cancer Paternal Grandfather    Stroke Paternal Grandfather    Breast cancer Neg Hx    Ovarian cancer Neg Hx     Social History Social History   Tobacco Use   Smoking status: Never   Smokeless tobacco: Never  Vaping Use   Vaping Use: Never used  Substance Use Topics   Alcohol use: No   Drug use: Not  Currently    Types: Marijuana     Allergies   Shellfish allergy   Review of Systems Review of Systems  Constitutional:  Positive for fever. Negative for chills.  HENT:  Positive for ear pain and sore throat.   Respiratory:  Negative for cough and shortness of breath.   Cardiovascular:  Negative for chest pain and palpitations.  Gastrointestinal:  Positive for nausea. Negative for abdominal pain, diarrhea and vomiting.  Musculoskeletal:  Positive for neck pain. Negative for arthralgias and back pain.  Skin:  Negative for color change and rash.  Neurological:  Positive for headaches. Negative for dizziness, weakness and numbness.  All other systems reviewed and are negative.    Physical Exam Triage Vital Signs ED Triage Vitals  Enc Vitals Group     BP 04/20/22 1317 111/74     Pulse Rate 04/20/22 1317 (!) 101     Resp 04/20/22 1317 (!) 22     Temp 04/20/22 1317 (!) 101.2 F (38.4 C)     Temp Source 04/20/22 1317 Oral     SpO2 04/20/22 1317 98 %     Weight --  Height --      Head Circumference --      Peak Flow --      Pain Score 04/20/22 1315 8     Pain Loc --      Pain Edu? --      Excl. in GC? --    No data found.  Updated Vital Signs BP 115/82 (BP Location: Left Arm)   Pulse 75   Temp (!) 100.4 F (38 C) (Oral)   Resp 20   LMP 04/13/2022   SpO2 100%   Visual Acuity Right Eye Distance:   Left Eye Distance:   Bilateral Distance:    Right Eye Near:   Left Eye Near:    Bilateral Near:     Physical Exam Vitals and nursing note reviewed.  Constitutional:      General: She is not in acute distress.    Appearance: She is well-developed. She is ill-appearing.  HENT:     Right Ear: Tympanic membrane normal.     Left Ear: Tympanic membrane normal.     Nose: Nose normal.     Mouth/Throat:     Mouth: Mucous membranes are moist.     Pharynx: Posterior oropharyngeal erythema present.  Cardiovascular:     Rate and Rhythm: Normal rate and regular rhythm.      Heart sounds: Normal heart sounds.  Pulmonary:     Effort: Pulmonary effort is normal. No respiratory distress.     Breath sounds: Normal breath sounds.  Abdominal:     General: Bowel sounds are normal.     Palpations: Abdomen is soft.     Tenderness: There is no abdominal tenderness. There is no guarding or rebound.  Musculoskeletal:     Cervical back: Normal range of motion and neck supple. No rigidity.  Skin:    General: Skin is warm and dry.  Neurological:     Mental Status: She is alert.  Psychiatric:        Mood and Affect: Mood normal.        Behavior: Behavior normal.      UC Treatments / Results  Labs (all labs ordered are listed, but only abnormal results are displayed) Labs Reviewed  POCT RAPID STREP A (OFFICE)    EKG   Radiology No results found.  Procedures Procedures (including critical care time)  Medications Ordered in UC Medications  ondansetron (ZOFRAN-ODT) disintegrating tablet 4 mg (4 mg Oral Given 04/20/22 1322)  acetaminophen (TYLENOL) tablet 650 mg (650 mg Oral Given 04/20/22 1332)    Initial Impression / Assessment and Plan / UC Course  I have reviewed the triage vital signs and the nursing notes.  Pertinent labs & imaging results that were available during my care of the patient were reviewed by me and considered in my medical decision making (see chart for details).    Viral illness, fever, nausea without vomiting.  Zofran and Tylenol given here. Patient able to tolerate with emesis.  Treating nausea with Zofran.  Discussed clear liquid diet.  Instructed patient to advance diet as tolerated.  Tylenol as needed for fever or discomfort.  ED precautions discussed.  Education provided on nausea and viral illness.  Instructed patient to follow-up with her PCP as needed.  She agrees to plan of care.   Final Clinical Impressions(s) / UC Diagnoses   Final diagnoses:  Viral illness  Fever, unspecified  Nausea without vomiting      Discharge Instructions      Take Tylenol for  fever.    Take the antinausea medication as directed.    Keep yourself hydrated with clear liquids, such as water and Gatorade.    Go to the emergency department if you have acute worsening symptoms.    Follow up with your primary care provider if your symptoms are not improving.          ED Prescriptions     Medication Sig Dispense Auth. Provider   ondansetron (ZOFRAN-ODT) 4 MG disintegrating tablet Take 1 tablet (4 mg total) by mouth every 8 (eight) hours as needed for nausea or vomiting. 20 tablet Mickie Bail, NP      PDMP not reviewed this encounter.   Mickie Bail, NP 04/20/22 1429

## 2022-04-20 NOTE — Discharge Instructions (Addendum)
Take Tylenol for fever.    Take the antinausea medication as directed.    Keep yourself hydrated with clear liquids, such as water and Gatorade.    Go to the emergency department if you have acute worsening symptoms.    Follow up with your primary care provider if your symptoms are not improving.

## 2022-04-20 NOTE — ED Notes (Signed)
Brandi Beavers, NP spoke to patient directly about symptoms

## 2022-05-04 DIAGNOSIS — Z419 Encounter for procedure for purposes other than remedying health state, unspecified: Secondary | ICD-10-CM | POA: Diagnosis not present

## 2022-06-04 DIAGNOSIS — Z419 Encounter for procedure for purposes other than remedying health state, unspecified: Secondary | ICD-10-CM | POA: Diagnosis not present

## 2022-07-05 DIAGNOSIS — Z419 Encounter for procedure for purposes other than remedying health state, unspecified: Secondary | ICD-10-CM | POA: Diagnosis not present

## 2022-07-12 ENCOUNTER — Emergency Department
Admission: EM | Admit: 2022-07-12 | Discharge: 2022-07-12 | Disposition: A | Discharge status: Home / Self Care | Payer: 59 | Attending: Emergency Medicine | Admitting: Emergency Medicine

## 2022-07-12 ENCOUNTER — Ambulatory Visit
Admission: EM | Admit: 2022-07-12 | Discharge: 2022-07-12 | Disposition: A | Discharge status: Home / Self Care | Payer: 59

## 2022-07-12 ENCOUNTER — Other Ambulatory Visit: Payer: Self-pay

## 2022-07-12 DIAGNOSIS — H5712 Ocular pain, left eye: Secondary | ICD-10-CM | POA: Insufficient documentation

## 2022-07-12 DIAGNOSIS — H538 Other visual disturbances: Secondary | ICD-10-CM | POA: Insufficient documentation

## 2022-07-12 DIAGNOSIS — R519 Headache, unspecified: Secondary | ICD-10-CM | POA: Diagnosis present

## 2022-07-12 LAB — CBC WITH DIFFERENTIAL/PLATELET
Abs Immature Granulocytes: 0.01 10*3/uL (ref 0.00–0.07)
Basophils Absolute: 0.1 10*3/uL (ref 0.0–0.1)
Basophils Relative: 1 %
Eosinophils Absolute: 0 10*3/uL (ref 0.0–0.5)
Eosinophils Relative: 1 %
HCT: 39 % (ref 36.0–46.0)
Hemoglobin: 12.2 g/dL (ref 12.0–15.0)
Immature Granulocytes: 0 %
Lymphocytes Relative: 49 %
Lymphs Abs: 2.2 10*3/uL (ref 0.7–4.0)
MCH: 26.8 pg (ref 26.0–34.0)
MCHC: 31.3 g/dL (ref 30.0–36.0)
MCV: 85.5 fL (ref 80.0–100.0)
Monocytes Absolute: 0.4 10*3/uL (ref 0.1–1.0)
Monocytes Relative: 8 %
Neutro Abs: 1.9 10*3/uL (ref 1.7–7.7)
Neutrophils Relative %: 41 %
Platelets: 348 10*3/uL (ref 150–400)
RBC: 4.56 MIL/uL (ref 3.87–5.11)
RDW: 12.4 % (ref 11.5–15.5)
WBC: 4.5 10*3/uL (ref 4.0–10.5)
nRBC: 0 % (ref 0.0–0.2)

## 2022-07-12 LAB — BASIC METABOLIC PANEL
Anion gap: 4 — ABNORMAL LOW (ref 5–15)
BUN: 10 mg/dL (ref 6–20)
CO2: 25 mmol/L (ref 22–32)
Calcium: 9.2 mg/dL (ref 8.9–10.3)
Chloride: 111 mmol/L (ref 98–111)
Creatinine, Ser: 0.71 mg/dL (ref 0.44–1.00)
GFR, Estimated: >60 mL/min (ref >60)
Glucose, Bld: 86 mg/dL (ref 70–99)
Potassium: 4.3 mmol/L (ref 3.5–5.1)
Sodium: 140 mmol/L (ref 135–145)

## 2022-07-12 MED ORDER — PROCHLORPERAZINE EDISYLATE 10 MG/2ML IJ SOLN
10.0000 mg | Freq: Once | INTRAMUSCULAR | Status: AC
Start: 2022-07-12 — End: 2022-07-12
  Administered 2022-07-12: 10 mg via INTRAVENOUS
  Filled 2022-07-12: qty 2

## 2022-07-12 MED ORDER — DIPHENHYDRAMINE HCL 50 MG/ML IJ SOLN
25.0000 mg | Freq: Once | INTRAMUSCULAR | Status: AC
Start: 2022-07-12 — End: 2022-07-12
  Administered 2022-07-12: 25 mg via INTRAVENOUS
  Filled 2022-07-12: qty 1

## 2022-07-12 MED ORDER — FLUORESCEIN SODIUM 1 MG OP STRP
1.0000 | ORAL_STRIP | Freq: Once | OPHTHALMIC | Status: AC
  Administered 2022-07-12: 1 via OPHTHALMIC
  Filled 2022-07-12: qty 1

## 2022-07-12 MED ORDER — TETRACAINE HCL 0.5 % OP SOLN
2.0000 [drp] | Freq: Once | OPHTHALMIC | Status: AC
  Administered 2022-07-12: 2 [drp] via OPHTHALMIC
  Filled 2022-07-12: qty 4

## 2022-07-12 MED ORDER — LACTATED RINGERS IV BOLUS
1000.0000 mL | Freq: Once | INTRAVENOUS | Status: AC
  Administered 2022-07-12: 1000 mL via INTRAVENOUS

## 2022-07-12 MED ORDER — CARBAMAZEPINE 100 MG PO CHEW
100.0000 mg | CHEWABLE_TABLET | Freq: Once | ORAL | Status: DC
  Filled 2022-07-12: qty 1

## 2022-07-12 MED ORDER — PROCHLORPERAZINE MALEATE 10 MG PO TABS: 10.0000 mg | ORAL_TABLET | Freq: Four times a day (QID) | ORAL | 0 refills | Status: AC | PRN

## 2022-07-12 NOTE — ED Notes (Signed)
Fluorescein strip and tetracaine given to EDP at this time.

## 2022-07-12 NOTE — ED Notes (Signed)
25 yof with a c/c of left eye watering for one week and pain since last night. The pt does normally wear glasses.

## 2022-07-12 NOTE — Discharge Instructions (Addendum)
Go to the emergency department for evaluation of your severe left eye pain.

## 2022-07-12 NOTE — ED Triage Notes (Signed)
Pt comes with left pain an swelling. Pt states she went to UC and was advised to come here. Pt states her eye has been watering.

## 2022-07-12 NOTE — ED Provider Notes (Signed)
[name]    CSN: [id number] Arrival date & time: 07/12/22  0915      History   Chief Complaint Chief Complaint  Patient presents with   Eye Pain    HPI Brandi Pearson is a 26 y.o. female.   Patient presents with sudden onset of 8/10 sharp shooting pain in her left eye last night.  The pain is worse this morning and now encompasses the left side of her face.  She also reports blurred vision and photophobia.  No falls or trauma.  Her left eye has been tearing for >1 week. She denies purulent eye drainage, eye redness, eye swelling, fever, chills, ear pain, sore throat, cough, shortness of breath, or other symptoms.  No OTC medications taken.     The history is provided by the patient and medical records.    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 Authorizing Provider  ondansetron (ZOFRAN-ODT) 4 MG disintegrating tablet Take 1 tablet (4 mg total) by mouth every 8 (eight) hours as needed for nausea or vomiting. 04/20/22   Mickie Bail, NP    Family History Family History  Problem Relation Age of Onset   Colon cancer Paternal Grandfather    Stroke Paternal Grandfather    Breast cancer Neg Hx    Ovarian  cancer Neg Hx     Social History Social History   Tobacco Use   Smoking status: Never   Smokeless tobacco: Never  Vaping Use   Vaping Use: Never used  Substance Use Topics   Alcohol use: No   Drug use: Not Currently    Types: Marijuana     Allergies   Shellfish allergy   Review of Systems Review of Systems  Constitutional:  Negative for chills and fever.  HENT:  Negative for ear pain and sore throat.   Eyes:  Positive for photophobia, pain, discharge and visual disturbance. Negative for redness and itching.  Respiratory:  Negative for cough and shortness of breath.   Skin:  Negative for color change, rash and wound.  Neurological:  Negative for weakness and numbness.  All other systems reviewed and are negative.    Physical Exam Triage Vital Signs ED Triage Vitals  Enc Vitals Group     BP      Pulse      Resp      Temp      Temp src      SpO2  Weight      Height      Head Circumference      Peak Flow      Pain Score      Pain Loc      Pain Edu?      Excl. in GC?    No data found.  Updated Vital Signs BP 118/78   Pulse 79   Temp 98.1 F (36.7 C)   Resp 18   Ht 5\' 4"  (1.626 m)   Wt 180 lb (81.6 kg)   LMP 07/08/2022   SpO2 98%   BMI 30.90 kg/m   Visual Acuity Right Eye Distance: 20/40 Left Eye Distance: 20/50 Bilateral Distance: 20/30  Right Eye Near:   Left Eye Near:    Bilateral Near:     Physical Exam Vitals and nursing note reviewed.  Constitutional:      General: She is not in acute distress.    Appearance: Normal appearance. She is well-developed. She is not ill-appearing.  HENT:     Right Ear: Tympanic membrane normal.     Left Ear: Tympanic membrane normal.     Nose: Nose normal.     Mouth/Throat:     Mouth: Mucous membranes are moist.     Pharynx: Oropharynx is clear.  Eyes:     General: Lids are normal. Vision grossly intact.        Right eye: No discharge.        Left eye: No discharge.     Extraocular Movements:  Extraocular movements intact.     Conjunctiva/sclera: Conjunctivae normal.     Pupils: Pupils are equal, round, and reactive to light.  Cardiovascular:     Rate and Rhythm: Normal rate and regular rhythm.     Heart sounds: Normal heart sounds.  Pulmonary:     Effort: Pulmonary effort is normal. No respiratory distress.     Breath sounds: Normal breath sounds.  Musculoskeletal:     Cervical back: Neck supple.  Skin:    General: Skin is warm and dry.     Findings: No bruising, erythema, lesion or rash.  Neurological:     General: No focal deficit present.     Mental Status: She is alert and oriented to person, place, and time.     Gait: Gait normal.  Psychiatric:        Mood and Affect: Mood normal.        Behavior: Behavior normal.      UC Treatments / Results  Labs (all labs ordered are listed, but only abnormal results are displayed) Labs Reviewed - No data to display  EKG   Radiology No results found.  Procedures Procedures (including critical care time)  Medications Ordered in UC Medications - No data to display  Initial Impression / Assessment and Plan / UC Course  I have reviewed the triage vital signs and the nursing notes.  Pertinent labs & imaging results that were available during my care of the patient were reviewed by me and considered in my medical decision making (see chart for details).   Acute pain in left eye.  Patient presents with sudden onset of acute pain in her left eye last night which has gotten worse this morning.  She states the eye pain is severe.  No trauma.  Sending her to the ED for evaluation.  She agrees to plan of care.   Final Clinical Impressions(s) / UC Diagnoses   Final diagnoses:  Acute pain in left eye  Discharge Instructions      Go to the emergency department for evaluation of your severe left eye pain.        ED Prescriptions   None    PDMP not reviewed this encounter.   Mickie Bail, NP 07/12/22  [phone]

## 2022-07-12 NOTE — ED Notes (Signed)
Patient is being discharged from the Urgent Care and sent to the Emergency Department via POV . Per Wendee Beavers, NP, patient is in need of higher level of care due to severe eye pain. Patient is aware and verbalizes understanding of plan of care.  Vitals:   07/12/22 0927  BP: 118/78  Pulse: 79  Resp: 18  Temp: 98.1 F (36.7 C)  SpO2: 98%

## 2022-07-12 NOTE — ED Triage Notes (Addendum)
Patient to Urgent Care with complaints of left eye pain and watering. Reports her eye has been watering for over a week, today she started experiencing shooting/ sharp pains that radiates into her head and down her face.   Patient has been using a warm compress at home.

## 2022-07-12 NOTE — ED Provider Notes (Signed)
[facility] Provider Note    Event Date/Time   First MD Initiated Contact with Patient 07/12/22 1102     (approximate)   History   Chief Complaint Eye Pain   HPI  Caitlen Kreeger is a 26 y.o. female with no significant past medical history who presents to the ED complaining of eye pain.  Patient reports that she has noticed watering from her left eye intermittently for about the past week.  She began to develop increasing pain around the eye and the left side of her head overnight last night.  She states the left side of her face seems sensitive to touch and she has had some mild blurry vision in the left eye.  She has not noticed any redness or swelling in the eye or around her face.  She denies significant pain with eye movements, does state that the bright lights bother her.  She reports that she has dealt with headaches in the past, denies specific history of migraines and denies any history of headaches similar to what she is experiencing today.  She initially presented to urgent care and was referred to the ED for further evaluation.  She reports wearing glasses but does not wear contact lenses.     Physical Exam   Triage Vital Signs: ED Triage Vitals  Enc Vitals Group     BP 07/12/22 1007 126/81     Pulse Rate 07/12/22 1007 76     Resp 07/12/22 1007 18     Temp 07/12/22 1007 97.9 F (36.6 C)     Temp Source 07/12/22 1007 Oral     SpO2 07/12/22 1007 97 %     Weight --      Height --      Head Circumference --      Peak Flow --      Pain Score 07/12/22 0958 4     Pain Loc --      Pain Edu? --      Excl. in GC? --     Most recent vital signs: Vitals:   07/12/22 1007  BP: 126/81  Pulse: 76  Resp: 18  Temp: 97.9 F (36.6 C)  SpO2: 97%    Constitutional: Alert and oriented. Eyes: Conjunctivae are normal.  Pupils equal, round, and reactive to light bilaterally.  No foreign bodies noted and no fluorescein uptake noted.  No pain with  extraocular movements. Head: Atraumatic.  No facial erythema or edema noted, tenderness to palpation noted periorbitally on the left extending over left cheek, nose and frontal scalp. Nose: No congestion/rhinnorhea. Mouth/Throat: Mucous membranes are moist.  Cardiovascular: Normal rate, regular rhythm. Grossly normal heart sounds.  2+ radial pulses bilaterally. Respiratory: Normal respiratory effort.  No retractions. Lungs CTAB. Gastrointestinal: Soft and nontender. No distention. Musculoskeletal: No lower extremity tenderness nor edema.  Neurologic:  Normal speech and language. No gross focal neurologic deficits are appreciated.    ED Results / Procedures / Treatments   Labs (all labs ordered are listed, but only abnormal results are displayed) Labs Reviewed  BASIC METABOLIC PANEL - Abnormal; Notable for the following components:      Result Value   Anion gap 4 (*)    All other components within normal limits  CBC WITH DIFFERENTIAL/PLATELET  POC URINE PREG, ED    PROCEDURES:  Critical Care performed: No  Procedures   MEDICATIONS ORDERED IN ED: Medications  fluorescein ophthalmic strip 1 strip (1 strip Left Eye Given by Other 07/12/22  1121)  tetracaine (PONTOCAINE) 0.5 % ophthalmic solution 2 drop (2 drops Left Eye Given by Other 07/12/22 1122)  prochlorperazine (COMPAZINE) injection 10 mg (10 mg Intravenous Given 07/12/22 1212)  diphenhydrAMINE (BENADRYL) injection 25 mg (25 mg Intravenous Given 07/12/22 1215)  lactated ringers bolus 1,000 mL (1,000 mLs Intravenous New Bag/Given 07/12/22 1218)     IMPRESSION / MDM / ASSESSMENT AND PLAN / ED COURSE  I reviewed the triage vital signs and the nursing notes.                              26 y.o. female with no significant past medical history who presents to the ED complaining of left eye watering for the past week, now with increasing pain over the left side developing overnight last night.  Patient's presentation is most  consistent with acute presentation with potential threat to life or bodily function.  Differential diagnosis includes, but is not limited to, migraine headache, cluster headache, trigeminal neuralgia, optic neuritis, iritis, conjunctivitis, corneal abrasion, glaucoma, ocular foreign body.  Patient nontoxic-appearing and in no acute distress, vital signs are unremarkable.  Visual acuity was checked at urgent care and is 20/40 on the right, 20/50 on the left.  On examination, there is no obvious pathology to the left eye with no conjunctivitis, foreign body, or corneal abrasion.  Pressure noted to be 22 in the left eye, symptoms do not seem consistent with glaucoma.  She has tenderness over the left side of her face potentially consistent with trigeminal neuralgia, optic neuritis unlikely given no pain with extraocular movements.  We will treat symptomatically with migraine cocktail, screen basic labs and reassess.  Labs are reassuring with no significant anemia, leukocytosis, joint abnormality, or AKI.  Patient reports pain has resolved following migraine cocktail and she is appropriate for discharge home with neurology follow-up for migraine headache versus trigeminal neuralgia.  She was counseled to return to the ED for new or worsening symptoms, patient agrees with plan.      FINAL CLINICAL IMPRESSION(S) / ED DIAGNOSES   Final diagnoses:  Acute nonintractable headache, unspecified headache type  Pain of left eye     Rx / DC Orders   ED Discharge Orders          Ordered    prochlorperazine (COMPAZINE) 10 MG tablet  Every 6 hours PRN        07/12/22 1319             Note:  This document was prepared using Dragon voice recognition software and may include unintentional dictation errors.   Chesley Noon, MD 07/12/22 1320

## 2022-08-04 DIAGNOSIS — Z419 Encounter for procedure for purposes other than remedying health state, unspecified: Secondary | ICD-10-CM | POA: Diagnosis not present

## 2022-09-04 DIAGNOSIS — Z419 Encounter for procedure for purposes other than remedying health state, unspecified: Secondary | ICD-10-CM | POA: Diagnosis not present

## 2022-10-04 DIAGNOSIS — Z419 Encounter for procedure for purposes other than remedying health state, unspecified: Secondary | ICD-10-CM | POA: Diagnosis not present

## 2022-11-04 DIAGNOSIS — Z419 Encounter for procedure for purposes other than remedying health state, unspecified: Secondary | ICD-10-CM | POA: Diagnosis not present

## 2022-12-05 DIAGNOSIS — Z419 Encounter for procedure for purposes other than remedying health state, unspecified: Secondary | ICD-10-CM | POA: Diagnosis not present

## 2023-01-03 DIAGNOSIS — Z419 Encounter for procedure for purposes other than remedying health state, unspecified: Secondary | ICD-10-CM | POA: Diagnosis not present

## 2023-02-03 DIAGNOSIS — Z419 Encounter for procedure for purposes other than remedying health state, unspecified: Secondary | ICD-10-CM | POA: Diagnosis not present

## 2023-03-05 DIAGNOSIS — Z419 Encounter for procedure for purposes other than remedying health state, unspecified: Secondary | ICD-10-CM | POA: Diagnosis not present

## 2023-04-05 DIAGNOSIS — Z419 Encounter for procedure for purposes other than remedying health state, unspecified: Secondary | ICD-10-CM | POA: Diagnosis not present

## 2023-05-05 DIAGNOSIS — Z419 Encounter for procedure for purposes other than remedying health state, unspecified: Secondary | ICD-10-CM | POA: Diagnosis not present

## 2023-06-05 DIAGNOSIS — Z419 Encounter for procedure for purposes other than remedying health state, unspecified: Secondary | ICD-10-CM | POA: Diagnosis not present

## 2023-07-06 DIAGNOSIS — Z419 Encounter for procedure for purposes other than remedying health state, unspecified: Secondary | ICD-10-CM | POA: Diagnosis not present

## 2023-08-05 DIAGNOSIS — Z419 Encounter for procedure for purposes other than remedying health state, unspecified: Secondary | ICD-10-CM | POA: Diagnosis not present

## 2023-09-05 DIAGNOSIS — Z419 Encounter for procedure for purposes other than remedying health state, unspecified: Secondary | ICD-10-CM | POA: Diagnosis not present

## 2023-10-05 DIAGNOSIS — Z419 Encounter for procedure for purposes other than remedying health state, unspecified: Secondary | ICD-10-CM | POA: Diagnosis not present

## 2023-11-05 DIAGNOSIS — Z419 Encounter for procedure for purposes other than remedying health state, unspecified: Secondary | ICD-10-CM | POA: Diagnosis not present

## 2023-11-27 IMAGING — CT CT HEAD W/O CM
4 series · 16 of 47 positions shown, 18 images · non-contrast
Comparison: CT head 09/16/2018.

CLINICAL DATA: Headache and neck pain after falling 2 days ago.

EXAM:
CT HEAD WITHOUT CONTRAST
CT CERVICAL SPINE WITHOUT CONTRAST
TECHNIQUE: Multidetector CT imaging of the head and cervical spine was
performed following the standard protocol without intravenous
contrast. Multiplanar CT image reconstructions of the cervical spine
were also generated.

[Series 2: head bone · axial · 0.41mm/px · z∈[+143,+175]mm · 3 of 79 slices shown]
[im 8/79  bone]
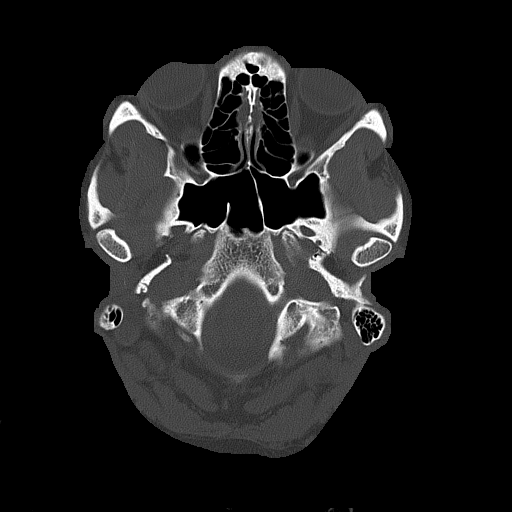
[im 16/79  bone]
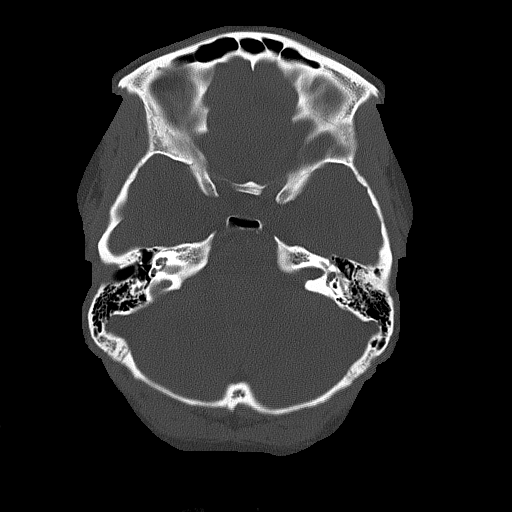
[im 24/79  bone]
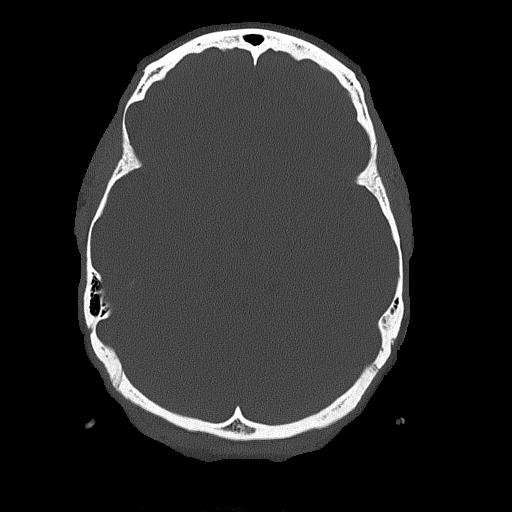

[Series 3: coronal soft tissue · coronal · 0.30mm/px · 3 of 69 slices shown]
[im 23/69  brain]
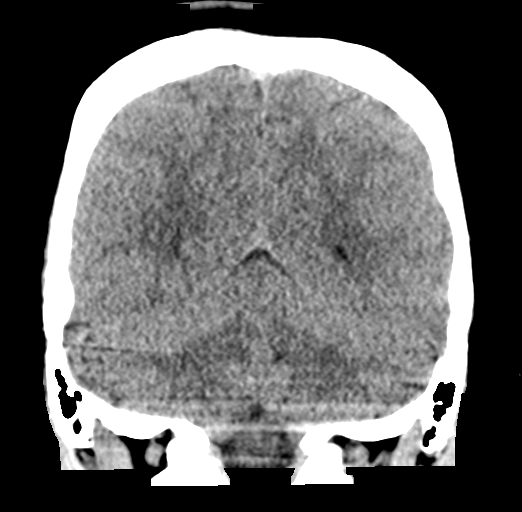
[im 31/69  brain]
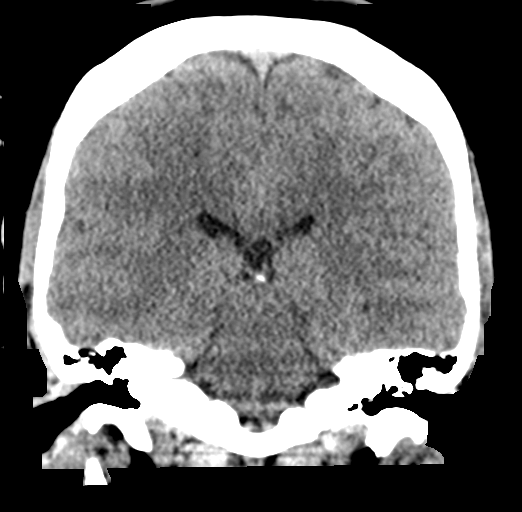
[im 38/69  brain]
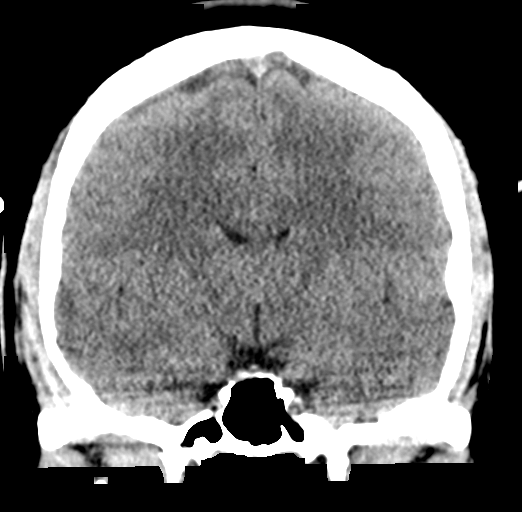

[Series 4: sagittal soft tissue · sagittal · 0.32mm/px · 3 of 57 slices shown]
[im 19/57  brain]
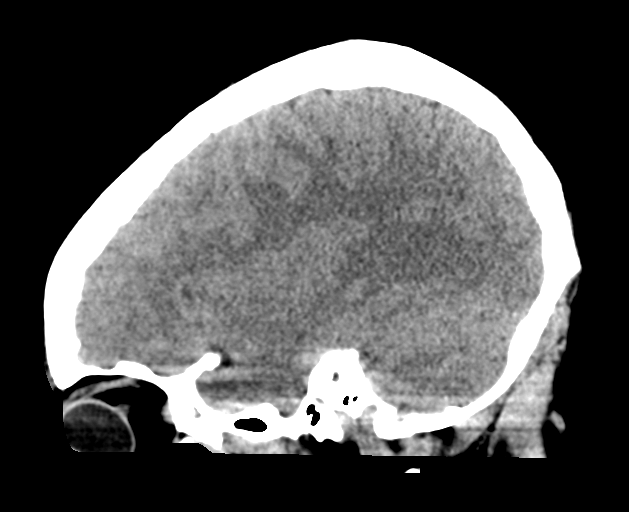
[im 29/57  brain]
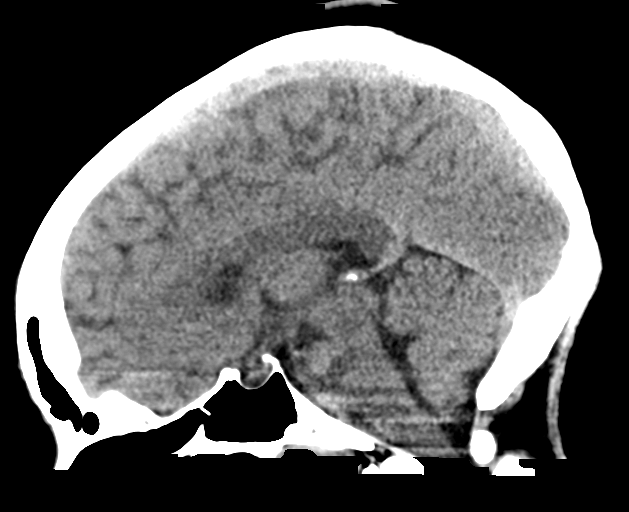
[im 38/57  brain]
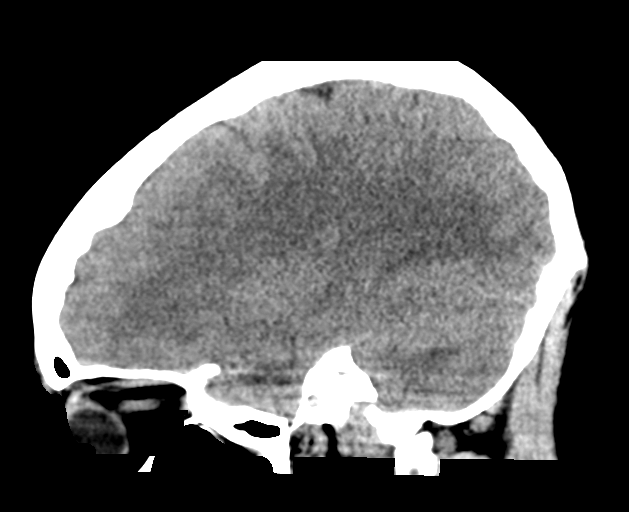

[Series 5: head wo · axial · 0.41mm/px · z∈[+144,+264]mm · 7 of 32 slices shown, 9 images]
[im 4/32  brain]
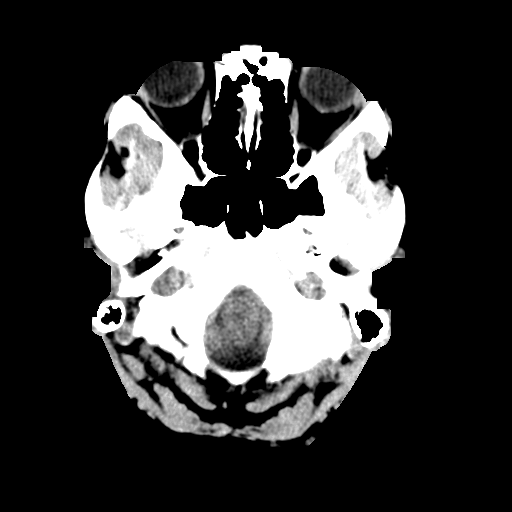
[im 4/32  bone]
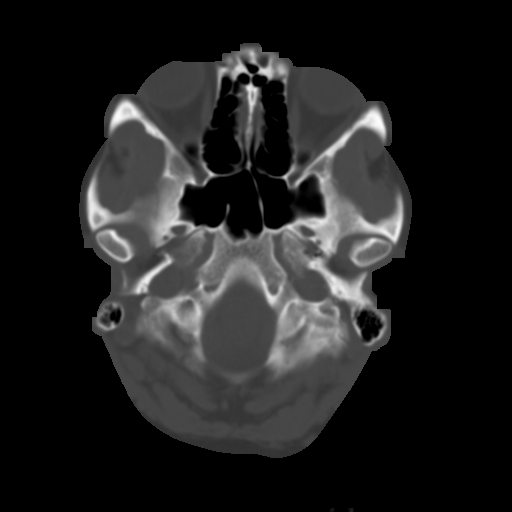
[im 8/32  brain]
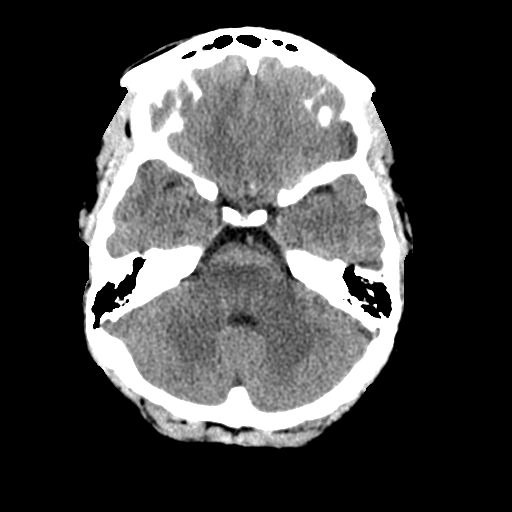
[im 12/32  brain]
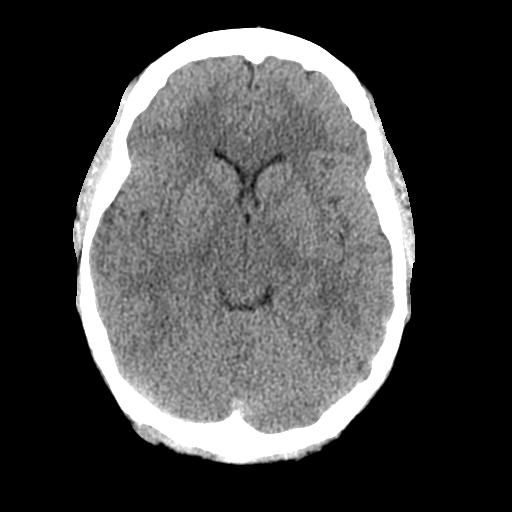
[im 16/32  brain]
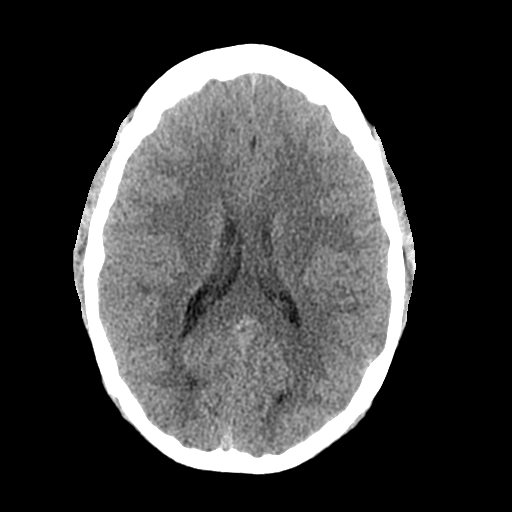
[im 20/32  brain]
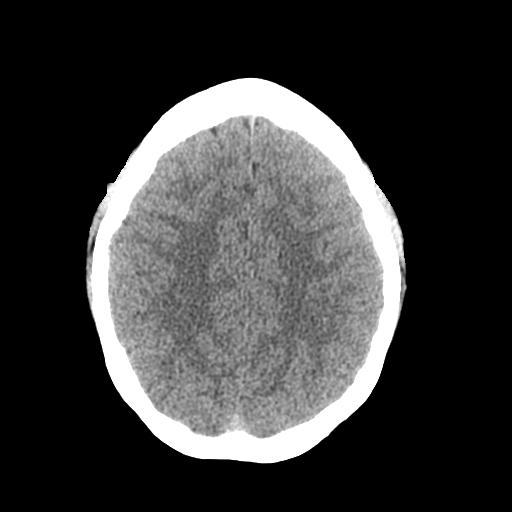
[im 20/32  bone]
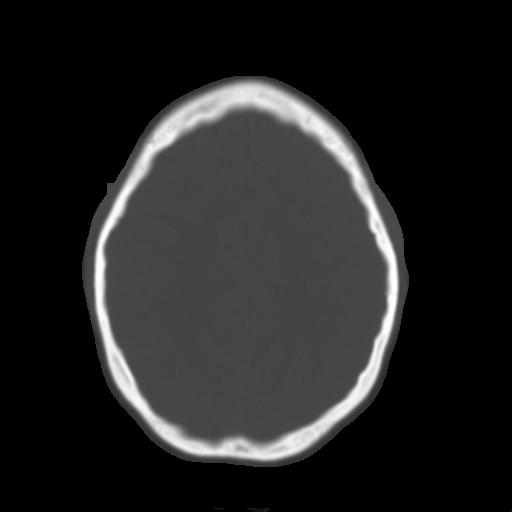
[im 24/32  brain]
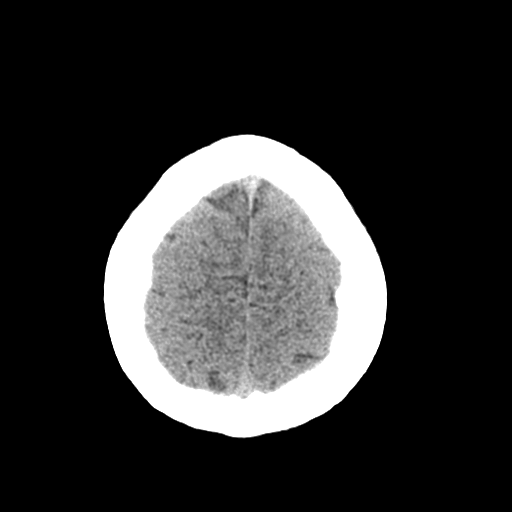
[im 28/32  brain]
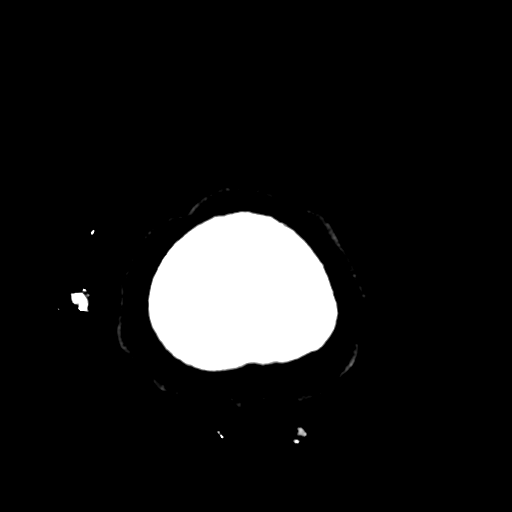

[16 of 47 positions shown; findings below may reference images not displayed]

FINDINGS: CT HEAD FINDINGS

Brain: There is no evidence of acute intracranial hemorrhage, mass
lesion, brain edema or extra-axial fluid collection. The ventricles
and subarachnoid spaces are appropriately sized for age. There is no
CT evidence of acute cortical infarction.

Vascular:  No hyperdense vessel identified.

Skull: Negative for fracture or focal lesion.

Sinuses/Orbits: The visualized paranasal sinuses and mastoid air
cells are clear. No orbital abnormalities are seen.

Other: None.

CT CERVICAL SPINE FINDINGS

Alignment: Straightening without focal angulation or listhesis.

Skull base and vertebrae: No evidence of acute cervical spine
fracture or traumatic subluxation.

Soft tissues and spinal canal: No prevertebral fluid or swelling. No
visible canal hematoma.

Disc levels: Disc heights are preserved. No evidence of large disc
herniation or significant spinal stenosis.

Upper chest: Unremarkable.

Other: None.
IMPRESSION: 1. Stable normal noncontrast head CT.
2. No evidence of acute cervical spine fracture, traumatic
subluxation or static signs of instability.

## 2023-11-27 IMAGING — CT CT CERVICAL SPINE W/O CM
3 of 4 series · 12 of 33 positions shown, 14 images · non-contrast
Comparison: CT head 09/16/2018.

CLINICAL DATA: Headache and neck pain after falling 2 days ago.

EXAM:
CT HEAD WITHOUT CONTRAST
CT CERVICAL SPINE WITHOUT CONTRAST
TECHNIQUE: Multidetector CT imaging of the head and cervical spine was
performed following the standard protocol without intravenous
contrast. Multiplanar CT image reconstructions of the cervical spine
were also generated.

[Series 6: sagittal bone · sagittal · 0.22mm/px · 5 of 61 slices shown, 6 images]
[im 21/61  bone]
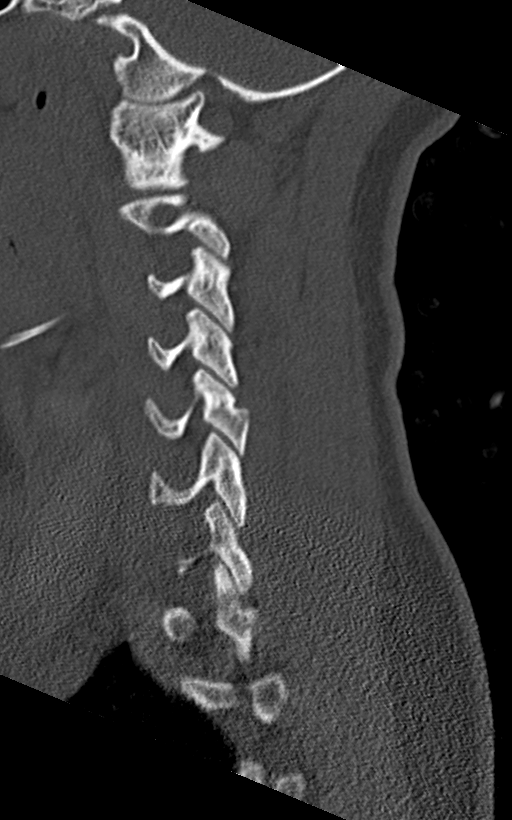
[im 26/61  bone]
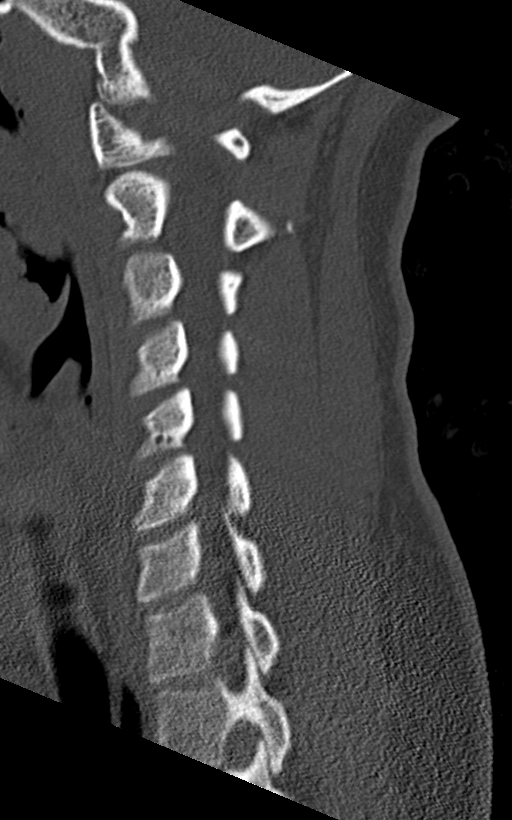
[im 31/61  soft-tissue]
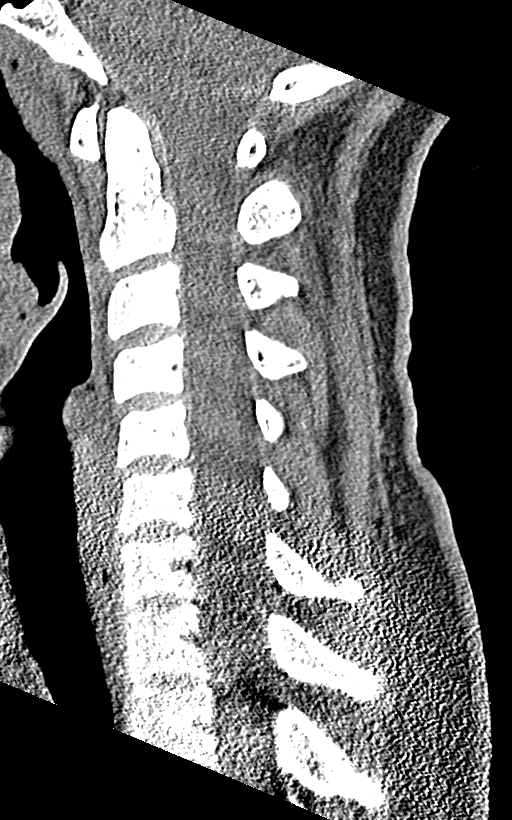
[im 31/61  bone]
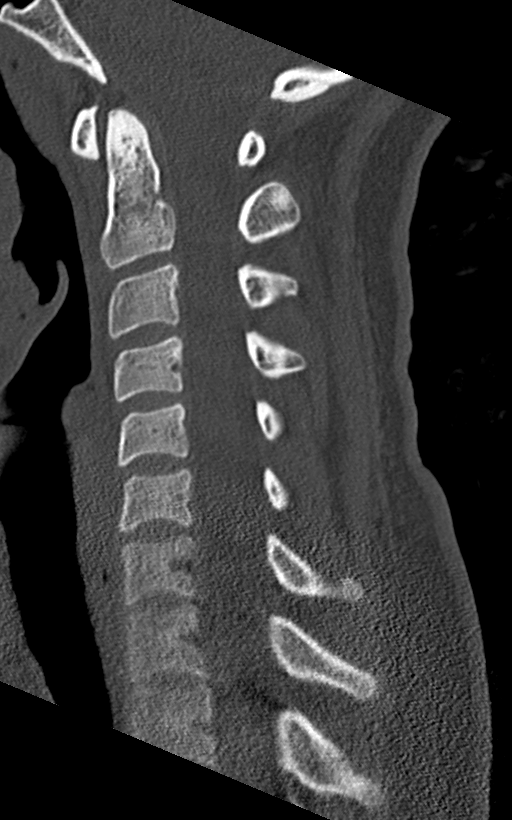
[im 36/61  bone]
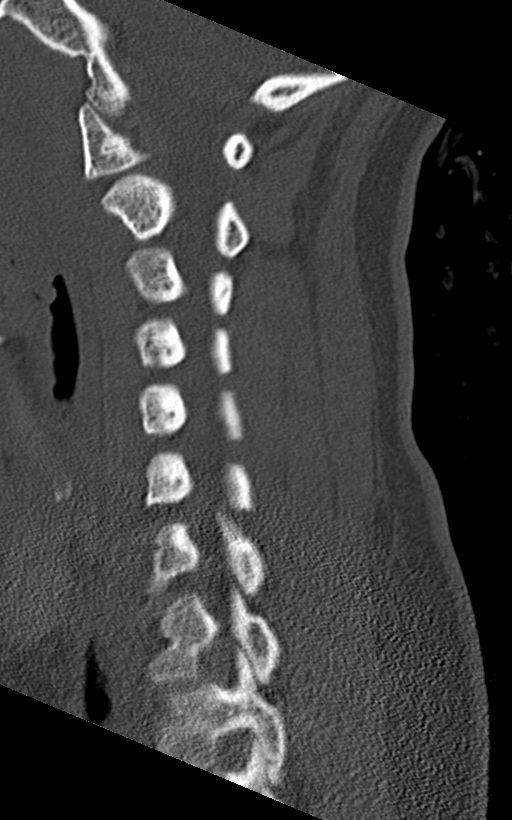
[im 41/61  bone]
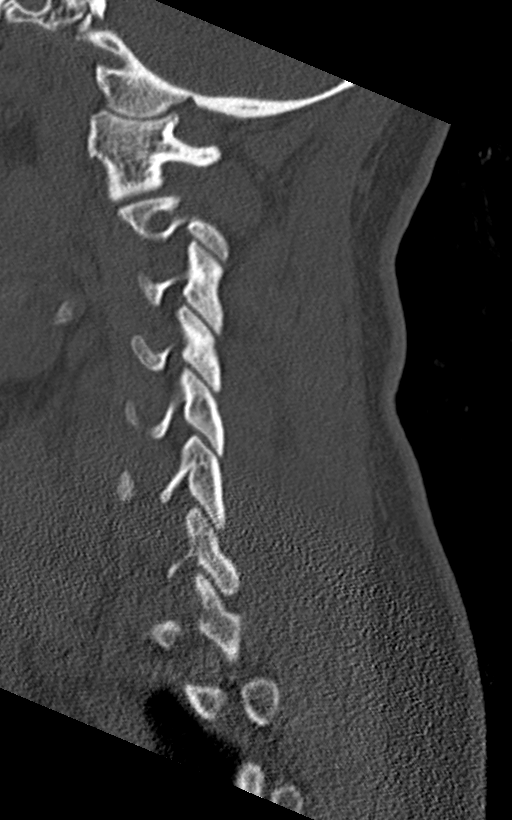

[Series 7: coronal bone · coronal · 0.23mm/px · 3 of 57 slices shown]
[im 12/57  bone]
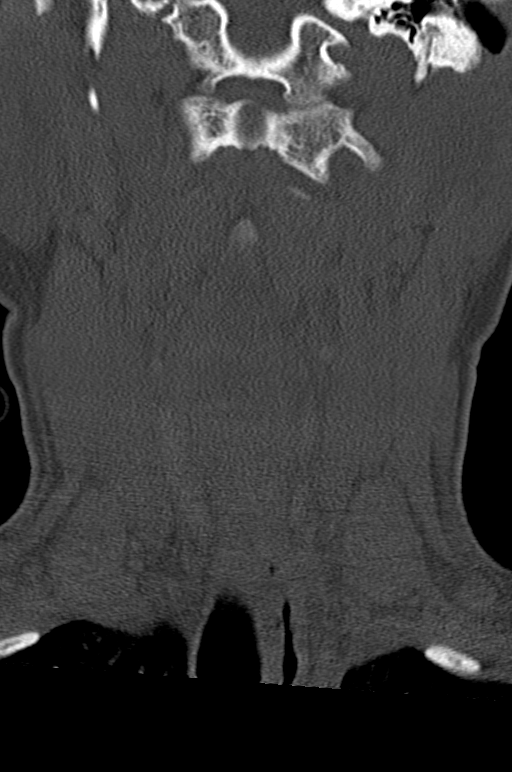
[im 23/57  bone]
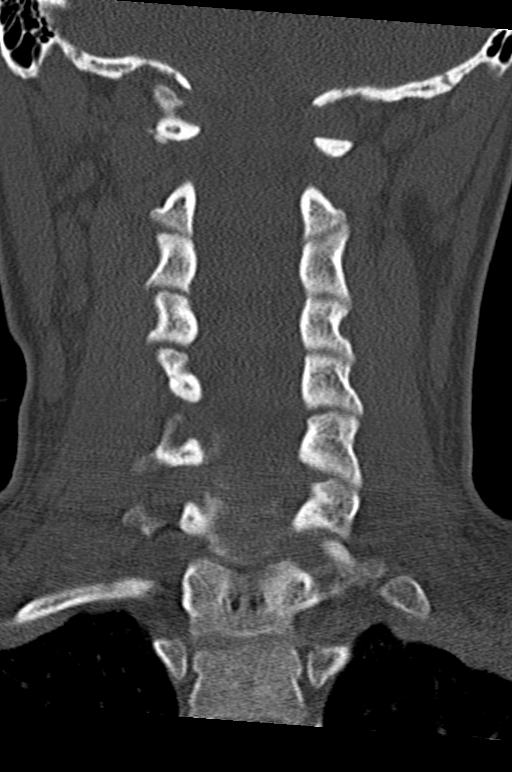
[im 34/57  bone]
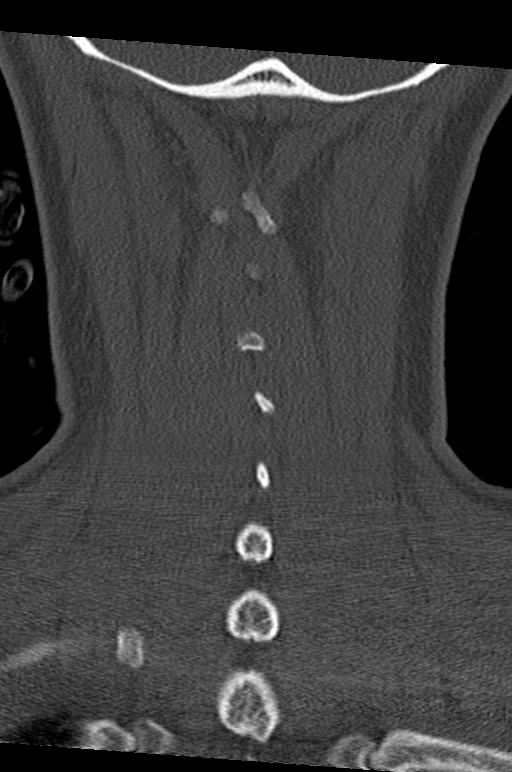

[Series 8: orthogonal bone · axial · 0.22mm/px · z∈[-11,+109]mm · 4 of 91 slices shown, 5 images]
[im 13/91  soft-tissue]
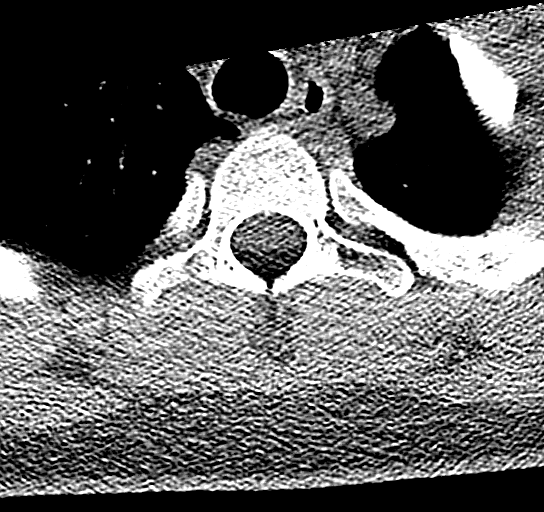
[im 13/91  bone]
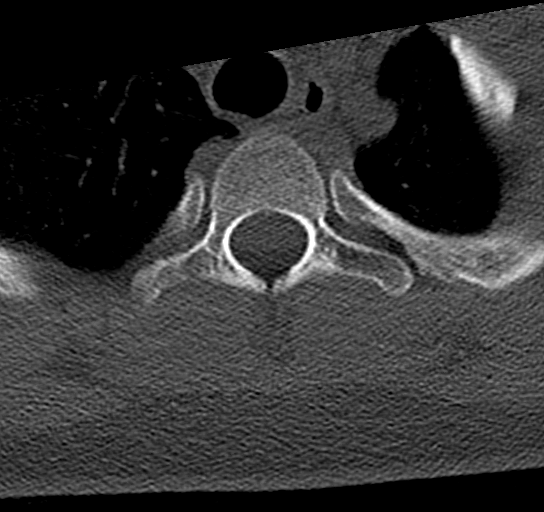
[im 39/91  bone]
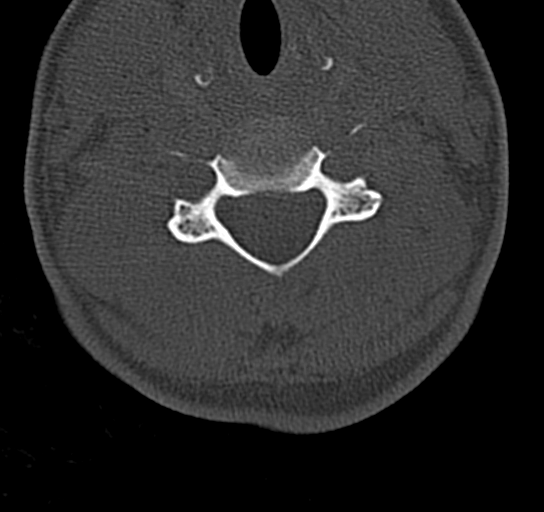
[im 52/91  bone]
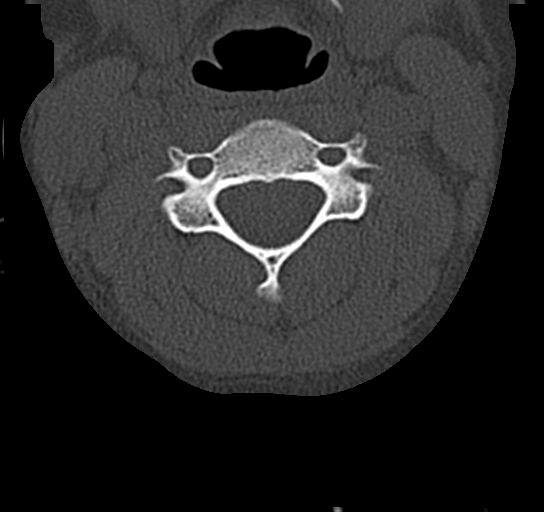
[im 78/91  bone]
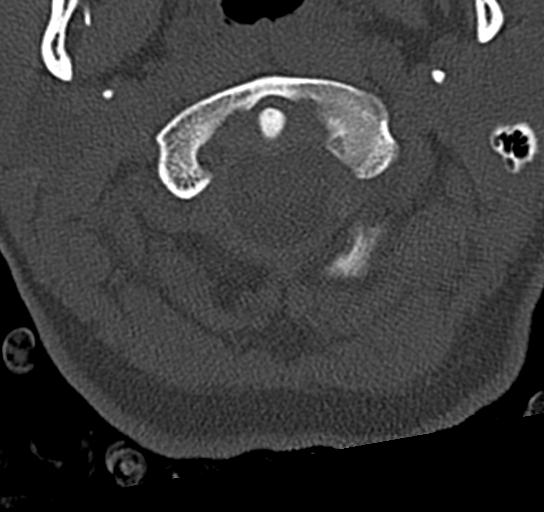

[12 of 33 positions shown; findings below may reference images not displayed]

FINDINGS: CT HEAD FINDINGS

Brain: There is no evidence of acute intracranial hemorrhage, mass
lesion, brain edema or extra-axial fluid collection. The ventricles
and subarachnoid spaces are appropriately sized for age. There is no
CT evidence of acute cortical infarction.

Vascular:  No hyperdense vessel identified.

Skull: Negative for fracture or focal lesion.

Sinuses/Orbits: The visualized paranasal sinuses and mastoid air
cells are clear. No orbital abnormalities are seen.

Other: None.

CT CERVICAL SPINE FINDINGS

Alignment: Straightening without focal angulation or listhesis.

Skull base and vertebrae: No evidence of acute cervical spine
fracture or traumatic subluxation.

Soft tissues and spinal canal: No prevertebral fluid or swelling. No
visible canal hematoma.

Disc levels: Disc heights are preserved. No evidence of large disc
herniation or significant spinal stenosis.

Upper chest: Unremarkable.

Other: None.
IMPRESSION: 1. Stable normal noncontrast head CT.
2. No evidence of acute cervical spine fracture, traumatic
subluxation or static signs of instability.

## 2023-12-06 DIAGNOSIS — Z419 Encounter for procedure for purposes other than remedying health state, unspecified: Secondary | ICD-10-CM | POA: Diagnosis not present

## 2024-01-03 DIAGNOSIS — Z419 Encounter for procedure for purposes other than remedying health state, unspecified: Secondary | ICD-10-CM | POA: Diagnosis not present

## 2024-02-14 DIAGNOSIS — Z419 Encounter for procedure for purposes other than remedying health state, unspecified: Secondary | ICD-10-CM | POA: Diagnosis not present

## 2024-03-15 DIAGNOSIS — Z419 Encounter for procedure for purposes other than remedying health state, unspecified: Secondary | ICD-10-CM | POA: Diagnosis not present

## 2024-04-15 DIAGNOSIS — Z419 Encounter for procedure for purposes other than remedying health state, unspecified: Secondary | ICD-10-CM | POA: Diagnosis not present

## 2024-05-15 DIAGNOSIS — Z419 Encounter for procedure for purposes other than remedying health state, unspecified: Secondary | ICD-10-CM | POA: Diagnosis not present

## 2024-06-15 DIAGNOSIS — Z419 Encounter for procedure for purposes other than remedying health state, unspecified: Secondary | ICD-10-CM | POA: Diagnosis not present

## 2024-07-16 DIAGNOSIS — Z419 Encounter for procedure for purposes other than remedying health state, unspecified: Secondary | ICD-10-CM | POA: Diagnosis not present

## 2024-08-15 DIAGNOSIS — Z419 Encounter for procedure for purposes other than remedying health state, unspecified: Secondary | ICD-10-CM | POA: Diagnosis not present

## 2024-09-15 DIAGNOSIS — Z419 Encounter for procedure for purposes other than remedying health state, unspecified: Secondary | ICD-10-CM | POA: Diagnosis not present
